# Patient Record
Sex: Female | Born: 1960 | Race: White | Hispanic: No | Smoking: Never smoker
Health system: Southern US, Community
[De-identification: ages and names within clinical notes are randomized; demographics above are authoritative.]

## PROBLEM LIST (undated history)

## (undated) DIAGNOSIS — R87619 Unspecified abnormal cytological findings in specimens from cervix uteri: Secondary | ICD-10-CM

## (undated) DIAGNOSIS — Z1371 Encounter for nonprocreative screening for genetic disease carrier status: Secondary | ICD-10-CM

## (undated) DIAGNOSIS — Z8041 Family history of malignant neoplasm of ovary: Secondary | ICD-10-CM

## (undated) HISTORY — DX: Encounter for nonprocreative screening for genetic disease carrier status: Z13.71

## (undated) HISTORY — DX: Family history of malignant neoplasm of ovary: Z80.41

## (undated) HISTORY — DX: Unspecified abnormal cytological findings in specimens from cervix uteri: R87.619

## (undated) HISTORY — PX: CERVICAL BIOPSY  W/ LOOP ELECTRODE EXCISION: SUR135

---

## 2007-06-04 ENCOUNTER — Ambulatory Visit: Payer: Self-pay | Admitting: Dermatology

## 2007-08-15 ENCOUNTER — Ambulatory Visit: Payer: Self-pay | Admitting: Unknown Physician Specialty

## 2007-08-19 ENCOUNTER — Ambulatory Visit: Payer: Self-pay | Admitting: Unknown Physician Specialty

## 2007-09-15 ENCOUNTER — Ambulatory Visit: Payer: Self-pay

## 2009-03-23 ENCOUNTER — Ambulatory Visit: Payer: Self-pay | Admitting: Otolaryngology

## 2009-10-18 ENCOUNTER — Ambulatory Visit: Payer: Self-pay | Admitting: General Surgery

## 2010-12-27 ENCOUNTER — Ambulatory Visit: Payer: Self-pay | Admitting: Family Medicine

## 2010-12-29 ENCOUNTER — Ambulatory Visit: Payer: Self-pay | Admitting: Internal Medicine

## 2011-01-04 ENCOUNTER — Ambulatory Visit: Payer: Self-pay

## 2014-07-03 ENCOUNTER — Ambulatory Visit
Admit: 2014-07-03 | Disposition: A | Discharge status: Home / Self Care | Payer: Self-pay | Attending: Family Medicine | Admitting: Family Medicine

## 2014-07-19 DIAGNOSIS — I428 Other cardiomyopathies: Secondary | ICD-10-CM | POA: Insufficient documentation

## 2014-07-19 DIAGNOSIS — R002 Palpitations: Secondary | ICD-10-CM | POA: Insufficient documentation

## 2014-07-19 DIAGNOSIS — I429 Cardiomyopathy, unspecified: Secondary | ICD-10-CM | POA: Insufficient documentation

## 2015-09-27 ENCOUNTER — Other Ambulatory Visit: Payer: Self-pay | Admitting: Obstetrics and Gynecology

## 2015-11-09 LAB — HM PAP SMEAR: HM Pap smear: NORMAL

## 2015-11-14 ENCOUNTER — Other Ambulatory Visit: Payer: Self-pay | Admitting: Obstetrics and Gynecology

## 2015-11-14 DIAGNOSIS — Z1231 Encounter for screening mammogram for malignant neoplasm of breast: Secondary | ICD-10-CM

## 2015-12-01 ENCOUNTER — Encounter: Payer: Self-pay | Admitting: Radiology

## 2015-12-01 ENCOUNTER — Ambulatory Visit
Admission: RE | Admit: 2015-12-01 | Discharge: 2015-12-01 | Disposition: A | Discharge status: Home / Self Care | Payer: BLUE CROSS/BLUE SHIELD | Source: Ambulatory Visit | Attending: Obstetrics and Gynecology | Admitting: Obstetrics and Gynecology

## 2015-12-01 DIAGNOSIS — Z1231 Encounter for screening mammogram for malignant neoplasm of breast: Secondary | ICD-10-CM | POA: Insufficient documentation

## 2016-07-04 DIAGNOSIS — H2513 Age-related nuclear cataract, bilateral: Secondary | ICD-10-CM | POA: Insufficient documentation

## 2017-01-08 ENCOUNTER — Telehealth: Payer: Self-pay | Admitting: Obstetrics and Gynecology

## 2017-01-08 DIAGNOSIS — Z1239 Encounter for other screening for malignant neoplasm of breast: Secondary | ICD-10-CM

## 2017-01-08 NOTE — Telephone Encounter (Signed)
Patient needs order for her screening mammogram sent to [facility].  She is scheduled for her annual with Jean Rosenthal on 02/07/2017.  She wants mammogram scheduled for the same day but [facility] needs the order before they will schedule the mammogram.

## 2017-02-04 ENCOUNTER — Ambulatory Visit
Admission: RE | Admit: 2017-02-04 | Discharge: 2017-02-04 | Disposition: A | Discharge status: Home / Self Care | Payer: BLUE CROSS/BLUE SHIELD | Source: Ambulatory Visit | Attending: Obstetrics and Gynecology | Admitting: Obstetrics and Gynecology

## 2017-02-04 DIAGNOSIS — Z1231 Encounter for screening mammogram for malignant neoplasm of breast: Secondary | ICD-10-CM | POA: Insufficient documentation

## 2017-02-04 DIAGNOSIS — Z1239 Encounter for other screening for malignant neoplasm of breast: Secondary | ICD-10-CM

## 2017-02-07 ENCOUNTER — Ambulatory Visit (INDEPENDENT_AMBULATORY_CARE_PROVIDER_SITE_OTHER): Payer: BLUE CROSS/BLUE SHIELD | Admitting: Obstetrics and Gynecology

## 2017-02-07 ENCOUNTER — Encounter: Payer: Self-pay | Admitting: Obstetrics and Gynecology

## 2017-02-07 VITALS — BP 122/70 | Ht 67.0 in | Wt 164.0 lb

## 2017-02-07 DIAGNOSIS — Z0000 Encounter for general adult medical examination without abnormal findings: Secondary | ICD-10-CM

## 2017-02-07 DIAGNOSIS — Z1331 Encounter for screening for depression: Secondary | ICD-10-CM

## 2017-02-07 DIAGNOSIS — Z1321 Encounter for screening for nutritional disorder: Secondary | ICD-10-CM | POA: Diagnosis not present

## 2017-02-07 DIAGNOSIS — Z131 Encounter for screening for diabetes mellitus: Secondary | ICD-10-CM

## 2017-02-07 DIAGNOSIS — Z1339 Encounter for screening examination for other mental health and behavioral disorders: Secondary | ICD-10-CM

## 2017-02-07 DIAGNOSIS — Z01419 Encounter for gynecological examination (general) (routine) without abnormal findings: Secondary | ICD-10-CM | POA: Diagnosis not present

## 2017-02-07 DIAGNOSIS — Z13220 Encounter for screening for lipoid disorders: Secondary | ICD-10-CM

## 2017-02-07 DIAGNOSIS — Z1329 Encounter for screening for other suspected endocrine disorder: Secondary | ICD-10-CM

## 2017-02-07 DIAGNOSIS — Z124 Encounter for screening for malignant neoplasm of cervix: Secondary | ICD-10-CM

## 2017-02-07 NOTE — Progress Notes (Signed)
Routine Annual Gynecology Examination   PCP: Patient, No Pcp Per  Chief Complaint:  Chief Complaint  Patient presents with  . Annual Exam    History of Present Illness: Patient is a 56 y.o. G3P3003 presents for annual exam. The patient has no complaints today.   Menopausal bleeding: denies  Menopausal symptoms: reports (reports mild)  Breast symptoms: denies  Last pap smear: 1 years ago.  Result Normal  (LEEP 2 years ago - CIN 1 result)  Last mammogram: This week.  Result Normal  Past Medical History:  Diagnosis Date  . Abnormal Pap smear of cervix     Past Surgical History:  Procedure Laterality Date  . CERVICAL BIOPSY  W/ LOOP ELECTRODE EXCISION      Prior to Admission medications   Medication Sig Start Date End Date Taking? Authorizing Provider  Multiple Vitamin (MULTIVITAMIN) tablet Take 1 tablet by mouth daily.   Yes [provider]    Allergies  Allergen Reactions  . Sulfa Antibiotics Rash     Obstetric History: [id number]  Social History   Socioeconomic History  . Marital status: Widowed    Spouse name: Not on file  . Number of children: Not on file  . Years of education: Not on file  . Highest education level: Not on file  Social Needs  . Financial resource strain: Not on file  . Food insecurity - worry: Not on file  . Food insecurity - inability: Not on file  . Transportation needs - medical: Not on file  . Transportation needs - non-medical: Not on file  Occupational History  . Not on file  Tobacco Use  . Smoking status: Never Smoker  . Smokeless tobacco: Never Used  Substance and Sexual Activity  . Alcohol use: Yes  . Drug use: No  . Sexual activity: Yes    Birth control/protection: Post-menopausal  Other Topics Concern  . Not on file  Social History Narrative  . Not on file    Family History  Problem Relation Age of Onset  . Breast cancer Maternal Grandmother   . Diabetes Mellitus II Maternal Grandmother   . Ovarian  cancer Maternal Grandmother   . Hypertension Mother   . Diabetes Mellitus II Father   . Hypertension Father   . Colon cancer Paternal Uncle   . Lung cancer Paternal Uncle     Review of Systems  Constitutional: Negative.   HENT: Negative.   Eyes: Negative.   Respiratory: Negative.   Cardiovascular: Negative.   Gastrointestinal: Negative.   Genitourinary: Negative.   Musculoskeletal: Negative.   Skin: Negative.   Neurological: Negative.   Psychiatric/Behavioral: Negative.      Physical Exam Vitals: BP 122/70   Ht 5\' 7"  (1.702 m)   Wt 164 lb (74.4 kg)   BMI 25.69 kg/m   Physical Exam  Constitutional: She is oriented to person, place, and time. She appears well-developed and well-nourished. No distress.  Genitourinary: Uterus normal. Pelvic exam was performed with patient supine. There is no rash, tenderness, lesion or injury on the right labia. There is no rash, tenderness, lesion or injury on the left labia. No erythema, tenderness or bleeding in the vagina. No signs of injury around the vagina. No vaginal discharge found. Right adnexum does not display mass, does not display tenderness and does not display fullness. Left adnexum does not display mass, does not display tenderness and does not display fullness. Cervix does not exhibit motion tenderness, lesion, discharge or polyp.   Uterus  is mobile and anteverted. Uterus is not enlarged, tender or exhibiting a mass.  HENT:  Head: Normocephalic and atraumatic.  Eyes: EOM are normal. No scleral icterus.  Neck: Normal range of motion. Neck supple. No thyromegaly present.  Cardiovascular: Normal rate and regular rhythm. Exam reveals no gallop and no friction rub.  No murmur heard. Pulmonary/Chest: Effort normal and breath sounds normal. No respiratory distress. She has no wheezes. She has no rales. Right breast exhibits no inverted nipple, no mass, no nipple discharge, no skin change and no tenderness. Left breast exhibits no  inverted nipple, no mass, no nipple discharge, no skin change and no tenderness.  Abdominal: Soft. Bowel sounds are normal. She exhibits no distension and no mass. There is no tenderness. There is no rebound and no guarding.  Musculoskeletal: Normal range of motion. She exhibits no edema or tenderness.  Lymphadenopathy:    She has no cervical adenopathy.       Right: No inguinal adenopathy present.       Left: No inguinal adenopathy present.  Neurological: She is alert and oriented to person, place, and time. No cranial nerve deficit.  Skin: Skin is warm and dry. No rash noted. No erythema.  Psychiatric: She has a normal mood and affect. Her behavior is normal. Judgment normal.    Female chaperone present for pelvic and breast  portions of the physical exam  Results: AUDIT Questionnaire (screen for alcoholism): 1 PHQ-9: 2  Assessment and Plan:  56 y.o. G58P3003 female here for routine annual gynecologic examination  Plan: Problem List Items Addressed This Visit    None    Visit Diagnoses    Women's annual routine gynecological examination    -  Primary   Relevant Orders   Hgb A1c w/o eAG   VITAMIN D 25 Hydroxy (Vit-D Deficiency, Fractures)   TSH + free T4   Lipid Panel With LDL/HDL Ratio   Comprehensive metabolic panel   CBC   IGP, Aptima HPV, rfx 16/18,45   Screening for depression       Screening for alcohol problem       Screening cholesterol level       Relevant Orders   Lipid Panel With LDL/HDL Ratio   Screening for thyroid disorder       Relevant Orders   TSH + free T4   Screening for diabetes mellitus       Relevant Orders   Hgb A1c w/o eAG   Blood tests for routine general physical examination       Relevant Orders   Comprehensive metabolic panel   CBC   Encounter for vitamin deficiency screening       Relevant Orders   VITAMIN D 25 Hydroxy (Vit-D Deficiency, Fractures)   Pap smear for cervical cancer screening       Relevant Orders   IGP, Aptima HPV, rfx  16/18,45      Screening: -- Blood pressure screen normal -- Colonoscopy - not due -- Mammogram - not due -- Weight screening: normal -- Depression screening negative (PHQ-9) -- Nutrition: normal -- cholesterol screening: will obtain -- osteoporosis screening: not due -- tobacco screening: not using -- alcohol screening: AUDIT questionnaire indicates low-risk usage. -- family history of breast cancer screening: done. not at high risk. -- no evidence of domestic violence or intimate partner violence. -- STD screening: gonorrhea/chlamydia NAAT not collected per patient request. -- pap smear collected per ASCCP guidelines -- HPV vaccination series: not eligilbe   Thomasene Mohair, MD  02/07/2017 1:14 PM

## 2017-02-08 LAB — LIPID PANEL WITH LDL/HDL RATIO
Cholesterol, Total: 183 mg/dL (ref 100–199)
HDL: 65 mg/dL (ref >39)
LDL CALC: 99 mg/dL (ref 0–99)
LDL/HDL RATIO: 1.5 ratio (ref 0.0–3.2)
Triglycerides: 94 mg/dL (ref 0–149)
VLDL CHOLESTEROL CAL: 19 mg/dL (ref 5–40)

## 2017-02-08 LAB — COMPREHENSIVE METABOLIC PANEL
ALK PHOS: 89 IU/L (ref 39–117)
ALT: 20 IU/L (ref 0–32)
AST: 21 IU/L (ref 0–40)
Albumin/Globulin Ratio: 2.2 (ref 1.2–2.2)
Albumin: 4.6 g/dL (ref 3.5–5.5)
BUN/Creatinine Ratio: 14 (ref 9–23)
BUN: 13 mg/dL (ref 6–24)
Bilirubin Total: 0.3 mg/dL (ref 0.0–1.2)
CHLORIDE: 105 mmol/L (ref 96–106)
CO2: 23 mmol/L (ref 20–29)
Calcium: 10.1 mg/dL (ref 8.7–10.2)
Creatinine, Ser: 0.9 mg/dL (ref 0.57–1.00)
GFR calc Af Amer: 84 mL/min/{1.73_m2} (ref >59)
GFR calc non Af Amer: 73 mL/min/{1.73_m2} (ref >59)
GLUCOSE: 84 mg/dL (ref 65–99)
Globulin, Total: 2.1 g/dL (ref 1.5–4.5)
Potassium: 4.2 mmol/L (ref 3.5–5.2)
Sodium: 144 mmol/L (ref 134–144)
Total Protein: 6.7 g/dL (ref 6.0–8.5)

## 2017-02-08 LAB — CBC
HEMATOCRIT: 40.3 % (ref 34.0–46.6)
Hemoglobin: 13.5 g/dL (ref 11.1–15.9)
MCH: 31 pg (ref 26.6–33.0)
MCHC: 33.5 g/dL (ref 31.5–35.7)
MCV: 93 fL (ref 79–97)
PLATELETS: 248 10*3/uL (ref 150–379)
RBC: 4.35 x10E6/uL (ref 3.77–5.28)
RDW: 13.4 % (ref 12.3–15.4)
WBC: 6.2 10*3/uL (ref 3.4–10.8)

## 2017-02-08 LAB — TSH+FREE T4
Free T4: 1.31 ng/dL (ref 0.82–1.77)
TSH: 1.67 u[IU]/mL (ref 0.450–4.500)

## 2017-02-08 LAB — VITAMIN D 25 HYDROXY (VIT D DEFICIENCY, FRACTURES): Vit D, 25-Hydroxy: 38 ng/mL (ref 30.0–100.0)

## 2017-02-08 LAB — HGB A1C W/O EAG: HEMOGLOBIN A1C: 5.2 % (ref 4.8–5.6)

## 2017-02-12 LAB — IGP, APTIMA HPV, RFX 16/18,45
HPV Aptima: NEGATIVE
PAP Smear Comment: 0

## 2017-02-19 ENCOUNTER — Encounter: Payer: Self-pay | Admitting: Obstetrics and Gynecology

## 2017-07-04 ENCOUNTER — Telehealth: Payer: Self-pay

## 2017-07-04 NOTE — Telephone Encounter (Signed)
Carla Krause calling to see if they can refax this specific form, I have not received anything on this yet. Will show SDJ once I get fax

## 2017-07-04 NOTE — Telephone Encounter (Signed)
Pt calling triage line to speak to SDJ regarding a possible Rx. She states she recently went to a HRT conference. She is interested in trying a hormone cream. (Name of medication not provided). She states the representative for the cream is going to help her out. He sent over a form for a Rx and she hasn't heard anything from [facility] about it.  CB# [phone]

## 2017-07-15 NOTE — Telephone Encounter (Signed)
Pt f/u to verify if we received fax from pharmacy on hormone cream and if it is going to be approved by SDJ. Cb#[phone]

## 2017-07-16 NOTE — Telephone Encounter (Signed)
Spoke with pt. Aware that I will speak with SDj Thursday and call her as soon as he lets me know about approval. Pt appreciative and states that [facility] had approved it before if that will help with his approval.

## 2017-07-16 NOTE — Telephone Encounter (Signed)
Since pt was not on this medication at last visit, I have to get SDJ approval. He will be back in office Thursday.

## 2017-07-16 NOTE — Telephone Encounter (Signed)
Ok. Please make sure I see this form on Thursday.  Thank you!

## 2017-07-16 NOTE — Telephone Encounter (Signed)
Pt aware of this via vm this AM

## 2017-07-16 NOTE — Telephone Encounter (Signed)
I received fax form while SDJ out last week. And I was out yesterday.

## 2017-07-18 NOTE — Telephone Encounter (Signed)
Pt aware that RX has been sent. 

## 2017-09-19 ENCOUNTER — Telehealth: Payer: Self-pay

## 2017-09-19 NOTE — Telephone Encounter (Signed)
I approved the rx. Please see note on the MA desk on my hallway to fax back to [facility]. Thanks!

## 2017-09-19 NOTE — Telephone Encounter (Signed)
Pt reports a rf request was sent from her pharmacy 2x in the past 2 wks for her hormone cream & hasn't been approved yet. Pt inquiring why or if this can be refilled. Cb#[phone]

## 2017-09-20 NOTE — Telephone Encounter (Signed)
Rx was faxed earlier today. Pt aware.

## 2017-11-18 ENCOUNTER — Telehealth: Payer: Self-pay

## 2017-11-18 NOTE — Telephone Encounter (Signed)
Pt called for refill of hormone cream and hasn't heard back.  [phone]

## 2017-11-21 NOTE — Telephone Encounter (Signed)
Just ask her if this is the same dose.  I'm ok to approve.  But, I'm not sure whether the dose has changed.  Usually, there is a paper to sign.  If there is no paper, perhaps [facility] can re-send a copy of the prescription.

## 2017-11-21 NOTE — Telephone Encounter (Signed)
Spoke with pt, informed her I have not seen a form regarding this RX and she will get pharmacist to refax it now and I will make sure SDJ signs it tomorrow assuming we get fax before tomororw. Will call pt tomorrow to update her.

## 2017-11-22 ENCOUNTER — Encounter: Payer: Self-pay | Admitting: Obstetrics and Gynecology

## 2017-11-22 NOTE — Telephone Encounter (Signed)
Received a RX this morning, called to clarify. It was the wrong RX that was sent over. So waiting and the correct/new RX to be sent over for SDJ to sign.

## 2017-11-22 NOTE — Telephone Encounter (Signed)
Pt aware that I faxed in the RX

## 2018-02-12 ENCOUNTER — Telehealth: Payer: Self-pay

## 2018-02-12 NOTE — Telephone Encounter (Signed)
Pt calling for BS. States needs hormone cream refill.  [phone]

## 2018-02-12 NOTE — Telephone Encounter (Signed)
Is that the one on my desk from the pharmacist?

## 2018-02-14 NOTE — Telephone Encounter (Signed)
Yes sir it is

## 2018-02-14 NOTE — Telephone Encounter (Signed)
For the sake of completion, I signed this and handed back to you. Please fax back. Thank you!

## 2018-02-18 NOTE — Telephone Encounter (Signed)
Called pt to let her know. Please relay message if she calls back. Thank you

## 2018-03-13 ENCOUNTER — Other Ambulatory Visit: Payer: Self-pay | Admitting: Obstetrics and Gynecology

## 2018-03-26 ENCOUNTER — Telehealth: Payer: Self-pay

## 2018-03-26 NOTE — Telephone Encounter (Signed)
Pt needs refill of hormone capsules; pharm is going to send fax.  Will be out after Friday.  [phone]

## 2018-03-26 NOTE — Telephone Encounter (Signed)
Will give RX to SDJ tomorrow when he is in office

## 2018-04-02 ENCOUNTER — Other Ambulatory Visit (HOSPITAL_COMMUNITY)
Admission: RE | Admit: 2018-04-02 | Discharge: 2018-04-02 | Disposition: A | Discharge status: Home / Self Care | Payer: BLUE CROSS/BLUE SHIELD | Source: Ambulatory Visit | Attending: Obstetrics and Gynecology | Admitting: Obstetrics and Gynecology

## 2018-04-02 ENCOUNTER — Ambulatory Visit (INDEPENDENT_AMBULATORY_CARE_PROVIDER_SITE_OTHER): Payer: BLUE CROSS/BLUE SHIELD | Admitting: Obstetrics and Gynecology

## 2018-04-02 ENCOUNTER — Encounter: Payer: Self-pay | Admitting: Obstetrics and Gynecology

## 2018-04-02 VITALS — BP 120/84 | HR 67 | Ht 67.0 in | Wt 184.0 lb

## 2018-04-02 DIAGNOSIS — Z1239 Encounter for other screening for malignant neoplasm of breast: Secondary | ICD-10-CM

## 2018-04-02 DIAGNOSIS — Z1211 Encounter for screening for malignant neoplasm of colon: Secondary | ICD-10-CM | POA: Diagnosis not present

## 2018-04-02 DIAGNOSIS — N951 Menopausal and female climacteric states: Secondary | ICD-10-CM

## 2018-04-02 DIAGNOSIS — Z124 Encounter for screening for malignant neoplasm of cervix: Secondary | ICD-10-CM

## 2018-04-02 DIAGNOSIS — Z131 Encounter for screening for diabetes mellitus: Secondary | ICD-10-CM

## 2018-04-02 DIAGNOSIS — Z01419 Encounter for gynecological examination (general) (routine) without abnormal findings: Secondary | ICD-10-CM | POA: Diagnosis not present

## 2018-04-02 DIAGNOSIS — Z79890 Hormone replacement therapy (postmenopausal): Secondary | ICD-10-CM

## 2018-04-02 DIAGNOSIS — Z0000 Encounter for general adult medical examination without abnormal findings: Secondary | ICD-10-CM

## 2018-04-02 DIAGNOSIS — Z1151 Encounter for screening for human papillomavirus (HPV): Secondary | ICD-10-CM | POA: Insufficient documentation

## 2018-04-02 DIAGNOSIS — Z13220 Encounter for screening for lipoid disorders: Secondary | ICD-10-CM

## 2018-04-02 DIAGNOSIS — Z8041 Family history of malignant neoplasm of ovary: Secondary | ICD-10-CM

## 2018-04-02 DIAGNOSIS — Z1329 Encounter for screening for other suspected endocrine disorder: Secondary | ICD-10-CM

## 2018-04-02 LAB — HEMOCCULT GUIAC POC 1CARD (OFFICE): Fecal Occult Blood, POC: NEGATIVE

## 2018-04-02 NOTE — Progress Notes (Signed)
PCP: Patient, No Pcp Per   Chief Complaint  Patient presents with  . Gynecologic Exam    HPI:      [name] is a 58 y.o. [phone] who LMP was No LMP recorded. Patient is postmenopausal., presents today for her annual examination.  Her menses are absent due to menopause. She does not have postmenopausal bleeding. She does have vasomotor sx, improved with prog 100 mg QHS, started after working with Bufford Lope at [facility]. Pt also having insomnia, wt gain, and memory issues. Insomnia improving with prog. Changed from cream to caps about 5 wks ago with improvement.  Sex activity: not sexually active. She does not have vaginal dryness.  Last Pap: February 07, 2017  Results were: no abnormalities /neg HPV DNA.  Hx of STDs: HPV  Last mammogram: February 04, 2017  Results were: normal--routine follow-up in 12 months There is no FH of breast cancer. There is a FH of ovarian cancer in her MGM, genetic testing not done. The patient does do self-breast exams.  Colonoscopy: never. FH colon cancer in pat uncle so doesn't qualify for cologuard.   Tobacco use: The patient denies current or previous tobacco use. Alcohol use: social drinker Exercise: moderately active  She does get adequate calcium and Vitamin D in her diet.  Labs normal 12/18 but pt likes regularly.  Past Medical History:  Diagnosis Date  . Abnormal Pap smear of cervix   . Family history of ovarian cancer     Past Surgical History:  Procedure Laterality Date  . CERVICAL BIOPSY  W/ LOOP ELECTRODE EXCISION      Family History  Problem Relation Age of Onset  . Breast cancer Maternal Grandmother 60  . Diabetes Mellitus II Maternal Grandmother   . Ovarian cancer Maternal Grandmother 60  . Hypertension Mother   . Diabetes Mellitus II Father   . Hypertension Father   . Colon cancer Paternal Uncle 40  . Lung cancer Paternal Uncle   . Melanoma Brother     Social History   Socioeconomic History  . Marital  status: Widowed    Spouse name: Not on file  . Number of children: Not on file  . Years of education: Not on file  . Highest education level: Not on file  Occupational History  . Not on file  Social Needs  . Financial resource strain: Not on file  . Food insecurity:    Worry: Not on file    Inability: Not on file  . Transportation needs:    Medical: Not on file    Non-medical: Not on file  Tobacco Use  . Smoking status: Never Smoker  . Smokeless tobacco: Never Used  Substance and Sexual Activity  . Alcohol use: Yes  . Drug use: No  . Sexual activity: Not Currently    Birth control/protection: Post-menopausal  Lifestyle  . Physical activity:    Days per week: Not on file    Minutes per session: Not on file  . Stress: Not on file  Relationships  . Social connections:    Talks on phone: Not on file    Gets together: Not on file    Attends religious service: Not on file    Active member of club or organization: Not on file    Attends meetings of clubs or organizations: Not on file    Relationship status: Not on file  . Intimate partner violence:    Fear of current or ex partner: Not  on file    Emotionally abused: Not on file    Physically abused: Not on file    Forced sexual activity: Not on file  Other Topics Concern  . Not on file  Social History Narrative  . Not on file    Outpatient Medications Prior to Visit  Medication Sig Dispense Refill  . Multiple Vitamin (MULTIVITAMIN) tablet Take 1 tablet by mouth daily.    . progesterone (PROMETRIUM) 100 MG capsule Take 100 mg by mouth daily.     No facility-administered medications prior to visit.      ROS:  Review of Systems  Constitutional: Negative for fatigue, fever and unexpected weight change.  Respiratory: Negative for cough, shortness of breath and wheezing.   Cardiovascular: Negative for chest pain, palpitations and leg swelling.  Gastrointestinal: Negative for blood in stool, constipation, diarrhea,  nausea and vomiting.  Endocrine: Negative for cold intolerance, heat intolerance and polyuria.  Genitourinary: Negative for dyspareunia, dysuria, flank pain, frequency, genital sores, hematuria, menstrual problem, pelvic pain, urgency, vaginal bleeding, vaginal discharge and vaginal pain.  Musculoskeletal: Negative for back pain, joint swelling and myalgias.  Skin: Negative for rash.  Neurological: Negative for dizziness, syncope, light-headedness, numbness and headaches.  Hematological: Negative for adenopathy.  Psychiatric/Behavioral: Negative for agitation, confusion, sleep disturbance and suicidal ideas. The patient is not nervous/anxious.    BREAST: No symptoms    Objective: BP 120/84   Pulse 67   Ht 5\' 7"  (1.702 m)   Wt 184 lb (83.5 kg)   BMI 28.82 kg/m    Physical Exam Constitutional:      Appearance: She is well-developed.  Genitourinary:     Vulva, vagina, cervix, uterus, right adnexa and left adnexa normal.     No vulval lesion or tenderness noted.     No vaginal discharge, erythema or tenderness.     No cervical polyp.     Uterus is not enlarged or tender.     No right or left adnexal mass present.     Right adnexa not tender.     Left adnexa not tender.  Rectum:     Guaiac result negative.     No tenderness.  Neck:     Musculoskeletal: Normal range of motion.     Thyroid: No thyromegaly.  Cardiovascular:     Rate and Rhythm: Normal rate and regular rhythm.     Heart sounds: Normal heart sounds. No murmur.  Pulmonary:     Effort: Pulmonary effort is normal.     Breath sounds: Normal breath sounds.  Chest:     Breasts:        Right: No mass, nipple discharge, skin change or tenderness.        Left: No mass, nipple discharge, skin change or tenderness.  Abdominal:     Palpations: Abdomen is soft.     Tenderness: There is no abdominal tenderness. There is no guarding.  Musculoskeletal: Normal range of motion.  Neurological:     Mental Status: She is  alert and oriented to person, place, and time.     Cranial Nerves: No cranial nerve deficit.  Psychiatric:        Behavior: Behavior normal.  Vitals signs reviewed.     Results: Results for orders placed or performed in visit on 04/02/18 (from the past 24 hour(s))  POCT Occult Blood Stool     Status: Normal   Collection Time: 04/02/18  9:15 AM  Result Value Ref Range   Fecal Occult Blood,  POC Negative Negative   Card #1 Date     Card #2 Fecal Occult Blod, POC     Card #2 Date     Card #3 Fecal Occult Blood, POC     Card #3 Date      Assessment/Plan:  Encounter for annual routine gynecological examination  Cervical cancer screening - Plan: Cytology - PAP  Screening for HPV (human papillomavirus) - Plan: Cytology - PAP  Screening for breast cancer - Pt to sched mammo - Plan: MM 3D SCREEN BREAST BILATERAL  Family history of ovarian cancer - MyRisk testing discussed and done today. Will call with resutls.  - Plan: Integrated BRACAnalysis  Blood tests for routine general physical examination - Plan: Comprehensive metabolic panel, Lipid panel, TSH + free T4, Hemoglobin A1c  Screening cholesterol level - Plan: Lipid panel  Screening for diabetes mellitus - Plan: Hemoglobin A1c  Thyroid disorder screening - Plan: TSH + free T4  Screening for colon cancer - Neg FOBT. Refer to GI for scr colonoscopy due to age. - Plan: POCT Occult Blood Stool, Ambulatory referral to Gastroenterology  Vasomotor symptoms due to menopause - Sx improving with oral prog. Has f/u with Bufford Lope at [facility] in 1 wk. If still sx, can increase to 150 mg dose. Pt to f/u prn.  Hormone replacement therapy (HRT)          GYN counsel mammography screening, menopause, adequate intake of calcium and vitamin D, diet and exercise    F/U  Return in about 1 year (around 04/03/2019).  Alicia B. Copland, PA-C 04/02/2018 9:17 AM

## 2018-04-02 NOTE — Patient Instructions (Addendum)
I value your feedback and entrusting [place] with your care. If you get a [facility] patient survey, I would appreciate you taking the time to let us know about your experience today. Thank you!  [facility] at [facility]: [phone]

## 2018-04-03 ENCOUNTER — Telehealth: Payer: Self-pay

## 2018-04-03 ENCOUNTER — Other Ambulatory Visit: Payer: Self-pay

## 2018-04-03 DIAGNOSIS — Z1211 Encounter for screening for malignant neoplasm of colon: Secondary | ICD-10-CM

## 2018-04-03 LAB — LIPID PANEL
Chol/HDL Ratio: 3 ratio (ref 0.0–4.4)
Cholesterol, Total: 200 mg/dL — ABNORMAL HIGH (ref 100–199)
HDL: 67 mg/dL (ref >39)
LDL Calculated: 113 mg/dL — ABNORMAL HIGH (ref 0–99)
Triglycerides: 102 mg/dL (ref 0–149)
VLDL Cholesterol Cal: 20 mg/dL (ref 5–40)

## 2018-04-03 LAB — COMPREHENSIVE METABOLIC PANEL
ALT: 33 IU/L — ABNORMAL HIGH (ref 0–32)
AST: 30 IU/L (ref 0–40)
Albumin/Globulin Ratio: 2.1 (ref 1.2–2.2)
Albumin: 4.6 g/dL (ref 3.8–4.9)
Alkaline Phosphatase: 89 IU/L (ref 39–117)
BUN/Creatinine Ratio: 16 (ref 9–23)
BUN: 16 mg/dL (ref 6–24)
Bilirubin Total: 0.2 mg/dL (ref 0.0–1.2)
CO2: 20 mmol/L (ref 20–29)
CREATININE: 0.97 mg/dL (ref 0.57–1.00)
Calcium: 10.5 mg/dL — ABNORMAL HIGH (ref 8.7–10.2)
Chloride: 105 mmol/L (ref 96–106)
GFR calc Af Amer: 76 mL/min/{1.73_m2} (ref >59)
GFR calc non Af Amer: 66 mL/min/{1.73_m2} (ref >59)
Globulin, Total: 2.2 g/dL (ref 1.5–4.5)
Glucose: 84 mg/dL (ref 65–99)
Potassium: 4.2 mmol/L (ref 3.5–5.2)
Sodium: 139 mmol/L (ref 134–144)
TOTAL PROTEIN: 6.8 g/dL (ref 6.0–8.5)

## 2018-04-03 LAB — TSH+FREE T4
Free T4: 1.45 ng/dL (ref 0.82–1.77)
TSH: 1.13 u[IU]/mL (ref 0.450–4.500)

## 2018-04-03 LAB — HEMOGLOBIN A1C
Est. average glucose Bld gHb Est-mCnc: 103 mg/dL
Hgb A1c MFr Bld: 5.2 % (ref 4.8–5.6)

## 2018-04-03 NOTE — Progress Notes (Signed)
Pls give pt lab results. Calcium/ALT normal. Lipids normal and ok.

## 2018-04-03 NOTE — Telephone Encounter (Signed)
Call returned.  Colonoscopy scheduled for 04/16/18 at [facility] with Dr. Allegra Lai.  Thanks [employer]

## 2018-04-03 NOTE — Telephone Encounter (Signed)
Pt left vm to schedule a colonoscopy   °

## 2018-04-04 ENCOUNTER — Other Ambulatory Visit: Payer: Self-pay

## 2018-04-04 LAB — CYTOLOGY - PAP
Diagnosis: NEGATIVE
HPV (WINDOPATH): NOT DETECTED

## 2018-04-06 ENCOUNTER — Other Ambulatory Visit: Payer: Self-pay

## 2018-04-06 ENCOUNTER — Ambulatory Visit
Admission: EM | Admit: 2018-04-06 | Discharge: 2018-04-06 | Disposition: A | Discharge status: Home / Self Care | Payer: BLUE CROSS/BLUE SHIELD | Attending: Family Medicine | Admitting: Family Medicine

## 2018-04-06 ENCOUNTER — Ambulatory Visit (INDEPENDENT_AMBULATORY_CARE_PROVIDER_SITE_OTHER): Payer: BLUE CROSS/BLUE SHIELD

## 2018-04-06 ENCOUNTER — Encounter: Payer: Self-pay | Admitting: Gynecology

## 2018-04-06 DIAGNOSIS — S9001XA Contusion of right ankle, initial encounter: Secondary | ICD-10-CM

## 2018-04-06 DIAGNOSIS — W228XXA Striking against or struck by other objects, initial encounter: Secondary | ICD-10-CM

## 2018-04-06 DIAGNOSIS — M25571 Pain in right ankle and joints of right foot: Secondary | ICD-10-CM

## 2018-04-06 MED ORDER — ETODOLAC 500 MG PO TABS: 500.0000 mg | ORAL_TABLET | Freq: Two times a day (BID) | ORAL | 0 refills | Status: AC | PRN

## 2018-04-06 NOTE — Discharge Instructions (Addendum)
Recommend use Lodine 500mg  twice a day as needed for pain and swelling. Keep foot elevated as much as possible. Limit walking and advance activity as tolerated. Follow-up in 7 to 10 days if not improving.

## 2018-04-06 NOTE — ED Provider Notes (Signed)
[facility]    CSN: [id number] Arrival date & time: 04/06/18  1333     History   Chief Complaint Chief Complaint  Patient presents with  . Appointment  . Ankle Injury    HPI Carla Krause is a 58 y.o. female.   58 year old female presents with injury to her right ankle 3 days ago. She was moving a head board for a bed when it slipped and fell on her right ankle. She had immediate pain and some swelling but now concerned over possible fracture. She can fully move her ankle but pain and swelling continues. She has taken Tylenol with some relief. Has twisted/sprained ankle before but no previous fracture. No other chronic health issues. Takes Progesterone and multivitamin daily.   The history is provided by the patient.    Past Medical History:  Diagnosis Date  . Abnormal Pap smear of cervix   . Family history of ovarian cancer     Patient Active Problem List   Diagnosis Date Noted  . 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 Palpitations 07/19/2014  . Cardiomyopathy, idiopathic (HCC) 07/19/2014    Past Surgical History:  Procedure Laterality Date  . CERVICAL BIOPSY  W/ LOOP ELECTRODE EXCISION      OB History    Gravida  3   Para  3   Term  3   Preterm      AB      Living  3     SAB      TAB      Ectopic      Multiple      Live Births  3            Home Medications    Prior to Admission medications   Medication Sig Start Date End Date Taking? Authorizing Provider  Multiple Vitamin (MULTIVITAMIN) tablet Take 1 tablet by mouth daily.   Yes [provider]  progesterone (PROMETRIUM) 100 MG capsule Take 100 mg by mouth daily.   Yes [provider]  etodolac (LODINE) 500 MG tablet Take 1 tablet (500 mg total) by mouth 2 (two) times daily as needed (for pain). 04/06/18   Sudie GrumblingAmyot, Ann Berry, NP    Family History Family History  Problem Relation Age of Onset  . Breast cancer Maternal Grandmother 60  . Diabetes  Mellitus II Maternal Grandmother   . Ovarian cancer Maternal Grandmother        60s, no testing done  . Hypertension Mother   . Diabetes Mellitus II Father   . Hypertension Father   . Colon cancer Paternal Uncle        3340s, no testing done  . Lung cancer Paternal Uncle   . Melanoma Brother     Social History Social History   Tobacco Use  . Smoking status: Never Smoker  . Smokeless tobacco: Never Used  Substance Use Topics  . Alcohol use: Yes  . Drug use: No     Allergies   Sulfa antibiotics   Review of Systems Review of Systems  Constitutional: Negative for activity change, appetite change, chills, fatigue and fever.  Respiratory: Negative for chest tightness, shortness of breath and wheezing.   Cardiovascular: Negative for chest pain and leg swelling.  Gastrointestinal: Negative for nausea and vomiting.  Musculoskeletal: Positive for arthralgias, gait problem and joint swelling.  Skin: Positive for wound. Negative for color change and rash.  Neurological: Negative for dizziness, tremors, seizures, syncope, weakness, light-headedness, numbness and  headaches.  Hematological: Negative for adenopathy. Does not bruise/bleed easily.  Psychiatric/Behavioral: Negative.      Physical Exam Triage Vital Signs ED Triage Vitals  Enc Vitals Group     BP 04/06/18 1355 120/64     Pulse Rate 04/06/18 1355 72     Resp 04/06/18 1355 16     Temp 04/06/18 1355 (!) 97.5 F (36.4 C)     Temp Source 04/06/18 1355 Oral     SpO2 04/06/18 1355 100 %     Weight 04/06/18 1358 180 lb (81.6 kg)     Height 04/06/18 1358 5\' 7"  (1.702 m)     Head Circumference --      Peak Flow --      Pain Score 04/06/18 1357 5     Pain Loc --      Pain Edu? --      Excl. in GC? --    No data found.  Updated Vital Signs BP 120/64 (BP Location: Left Arm)   Pulse 72   Temp (!) 97.5 F (36.4 C) (Oral)   Resp 16   Ht 5\' 7"  (1.702 m)   Wt 180 lb (81.6 kg)   SpO2 100%   BMI 28.19 kg/m   Visual  Acuity Right Eye Distance:   Left Eye Distance:   Bilateral Distance:    Right Eye Near:   Left Eye Near:    Bilateral Near:     Physical Exam Vitals signs and nursing note reviewed.  Constitutional:      General: She is awake. She is not in acute distress.    Appearance: Normal appearance. She is well-developed and well-groomed.     Comments: Patient sitting comfortably in exam chair in no acute distress.   HENT:     Head: Normocephalic and atraumatic.     Right Ear: External ear normal.     Left Ear: External ear normal.  Eyes:     Extraocular Movements: Extraocular movements intact.     Conjunctiva/sclera: Conjunctivae normal.  Neck:     Musculoskeletal: Normal range of motion.  Cardiovascular:     Rate and Rhythm: Normal rate.  Pulmonary:     Effort: Pulmonary effort is normal.  Musculoskeletal: Normal range of motion.        General: Swelling, tenderness and signs of injury present.     Right ankle: She exhibits swelling. She exhibits normal range of motion, no ecchymosis and normal pulse. Tenderness. Lateral malleolus tenderness found. Achilles tendon normal.     Right lower leg: No edema.     Left lower leg: No edema.       Feet:     Comments: Has full range of motion of right ankle but pain with rotation and flexion. Swollen around lateral malleolus with small abrasion present. No redness or bruising present. No distinct warmth. Tender around entire lateral malleolus. Normal pulses and capillary refill. No neuro deficits noted.   Skin:    General: Skin is warm and dry.     Capillary Refill: Capillary refill takes less than 2 seconds.     Findings: Abrasion present. No bruising, ecchymosis or erythema.  Neurological:     General: No focal deficit present.     Mental Status: She is alert and oriented to person, place, and time.     Sensory: Sensation is intact. No sensory deficit.     Motor: Motor function is intact.     Gait: Gait is intact.  Psychiatric:  Mood and Affect: Mood normal.        Behavior: Behavior normal. Behavior is cooperative.        Thought Content: Thought content normal.        Judgment: Judgment normal.      UC Treatments / Results  Labs (all labs ordered are listed, but only abnormal results are displayed) Labs Reviewed - No data to display  EKG None  Radiology Dg Ankle Complete Right  Result Date: 04/06/2018 CLINICAL DATA:  Heavy object fell on right lateral malleolus. Pain and abrasion EXAM: RIGHT ANKLE - COMPLETE 3+ VIEW COMPARISON:  None. FINDINGS: No fracture or bone lesion. Ankle joint normally spaced and aligned.  No arthropathic changes. Moderate-sized plantar calcaneal spur. Soft tissues are unremarkable. IMPRESSION: 1. No fracture or ankle joint abnormality. 2. Moderate plantar calcaneal spur. Electronically Signed   By: Amie Portland M.D.   On: 04/06/2018 14:19    Procedures Procedures (including critical care time)  Medications Ordered in UC Medications - No data to display  Initial Impression / Assessment and Plan / UC Course  I have reviewed the triage vital signs and the nursing notes.  Pertinent labs & imaging results that were available during my care of the patient were reviewed by me and considered in my medical decision making (see chart for details).    Reviewed x-ray results with patient- no distinct fracture. Presence of heel spur- patient previously informed. Offered ace wrap but patient declined since wrap/pressure seems to increase pain at this time. Encouraged to elevate foot as much as possible. May take Lodine 500mg  twice a day as directed (patient has taken this before with good success). Limit walking and advance activity as tolerated. Follow-up in 7 to 10 days if not improving.   Final Clinical Impressions(s) / UC Diagnoses   Final diagnoses:  Contusion of right ankle, initial encounter  Acute right ankle pain     Discharge Instructions     Recommend use Lodine 500mg   twice a day as needed for pain and swelling. Keep foot elevated as much as possible. Limit walking and advance activity as tolerated. Follow-up in 7 to 10 days if not improving.     ED Prescriptions    Medication Sig Dispense Auth. Provider   etodolac (LODINE) 500 MG tablet Take 1 tablet (500 mg total) by mouth 2 (two) times daily as needed (for pain). 30 tablet Sudie Grumbling, NP     Controlled Substance Prescriptions [place] Controlled Substance Registry consulted? Not Applicable   Sudie Grumbling, NP 04/07/18 [phone]

## 2018-04-06 NOTE — ED Triage Notes (Signed)
Per patient x 3 days ago bed head fell on her right ankle. Per patient pain  When walking on right foot.

## 2018-04-07 NOTE — Progress Notes (Signed)
Left message stating lab results was normal and ok. If any questions please call

## 2018-04-09 ENCOUNTER — Other Ambulatory Visit: Payer: Self-pay

## 2018-04-09 ENCOUNTER — Telehealth: Payer: Self-pay | Admitting: Gastroenterology

## 2018-04-09 MED ORDER — NA SULFATE-K SULFATE-MG SULF 17.5-3.13-1.6 GM/177ML PO SOLN: 1.0000 | Freq: Once | ORAL | 0 refills | Status: AC

## 2018-04-09 NOTE — Telephone Encounter (Signed)
[name] with [facility] called & states patient needs her prep for the colonoscopy scheduled on 04-16-2018 with DR Allegra Lai called into [name] in [place]. (this in her son-in-law's pharmacy)

## 2018-04-09 NOTE — Telephone Encounter (Signed)
LVM to inform patient rx for Suprep has been sent  to [facility] in [place].  Thanks [employer]

## 2018-04-15 NOTE — Discharge Instructions (Signed)
General Anesthesia, Adult, Care After  This sheet gives you information about how to care for yourself after your procedure. Your health care provider may also give you more specific instructions. If you have problems or questions, contact your health care provider.  What can I expect after the procedure?  After the procedure, the following side effects are common:  Pain or discomfort at the IV site.  Nausea.  Vomiting.  Sore throat.  Trouble concentrating.  Feeling cold or chills.  Weak or tired.  Sleepiness and fatigue.  Soreness and body aches. These side effects can affect parts of the body that were not involved in surgery.  Follow these instructions at home:    For at least 24 hours after the procedure:  Have a responsible adult stay with you. It is important to have someone help care for you until you are awake and alert.  Rest as needed.  Do not:  Participate in activities in which you could fall or become injured.  Drive.  Use heavy machinery.  Drink alcohol.  Take sleeping pills or medicines that cause drowsiness.  Make important decisions or sign legal documents.  Take care of children on your own.  Eating and drinking  Follow any instructions from your health care provider about eating or drinking restrictions.  When you feel hungry, start by eating small amounts of foods that are soft and easy to digest (bland), such as toast. Gradually return to your regular diet.  Drink enough fluid to keep your urine pale yellow.  If you vomit, rehydrate by drinking water, juice, or clear broth.  General instructions  If you have sleep apnea, surgery and certain medicines can increase your risk for breathing problems. Follow instructions from your health care provider about wearing your sleep device:  Anytime you are sleeping, including during daytime naps.  While taking prescription pain medicines, sleeping medicines, or medicines that make you drowsy.  Return to your normal activities as told by your health care  provider. Ask your health care provider what activities are safe for you.  Take over-the-counter and prescription medicines only as told by your health care provider.  If you smoke, do not smoke without supervision.  Keep all follow-up visits as told by your health care provider. This is important.  Contact a health care provider if:  You have nausea or vomiting that does not get better with medicine.  You cannot eat or drink without vomiting.  You have pain that does not get better with medicine.  You are unable to pass urine.  You develop a skin rash.  You have a fever.  You have redness around your IV site that gets worse.  Get help right away if:  You have difficulty breathing.  You have chest pain.  You have blood in your urine or stool, or you vomit blood.  Summary  After the procedure, it is common to have a sore throat or nausea. It is also common to feel tired.  Have a responsible adult stay with you for the first 24 hours after general anesthesia. It is important to have someone help care for you until you are awake and alert.  When you feel hungry, start by eating small amounts of foods that are soft and easy to digest (bland), such as toast. Gradually return to your regular diet.  Drink enough fluid to keep your urine pale yellow.  Return to your normal activities as told by your health care provider. Ask your health care   provider what activities are safe for you.  This information is not intended to replace advice given to you by your health care provider. Make sure you discuss any questions you have with your health care provider.  Document Released: 05/28/2000 Document Revised: 10/05/2016 Document Reviewed: 10/05/2016  Elsevier Interactive Patient Education  2019 [employer]

## 2018-04-16 ENCOUNTER — Encounter
Admission: RE | Disposition: A | Discharge status: Home / Self Care | Payer: Self-pay | Source: Home / Self Care | Attending: Gastroenterology

## 2018-04-16 ENCOUNTER — Ambulatory Visit
Admission: RE | Admit: 2018-04-16 | Discharge: 2018-04-16 | Disposition: A | Discharge status: Home / Self Care | Payer: BLUE CROSS/BLUE SHIELD | Attending: Gastroenterology | Admitting: Gastroenterology

## 2018-04-16 ENCOUNTER — Ambulatory Visit: Payer: BLUE CROSS/BLUE SHIELD | Admitting: Anesthesiology

## 2018-04-16 DIAGNOSIS — Z8249 Family history of ischemic heart disease and other diseases of the circulatory system: Secondary | ICD-10-CM | POA: Diagnosis not present

## 2018-04-16 DIAGNOSIS — Z79890 Hormone replacement therapy (postmenopausal): Secondary | ICD-10-CM | POA: Diagnosis not present

## 2018-04-16 DIAGNOSIS — Z882 Allergy status to sulfonamides status: Secondary | ICD-10-CM | POA: Diagnosis not present

## 2018-04-16 DIAGNOSIS — Z791 Long term (current) use of non-steroidal anti-inflammatories (NSAID): Secondary | ICD-10-CM | POA: Insufficient documentation

## 2018-04-16 DIAGNOSIS — Z1211 Encounter for screening for malignant neoplasm of colon: Secondary | ICD-10-CM | POA: Insufficient documentation

## 2018-04-16 HISTORY — PX: COLONOSCOPY WITH PROPOFOL: SHX5780

## 2018-04-16 SURGERY — COLONOSCOPY WITH PROPOFOL
Anesthesia: General | Site: Rectum

## 2018-04-16 MED ORDER — LACTATED RINGERS IV SOLN
INTRAVENOUS | Status: DC | PRN
  Administered 2018-04-16: 09:00:00 via INTRAVENOUS

## 2018-04-16 MED ORDER — PROPOFOL 10 MG/ML IV BOLUS
INTRAVENOUS | Status: DC | PRN
  Administered 2018-04-16: 20 mg via INTRAVENOUS
  Administered 2018-04-16: 30 mg via INTRAVENOUS
  Administered 2018-04-16: 100 mg via INTRAVENOUS
  Administered 2018-04-16: 30 mg via INTRAVENOUS
  Administered 2018-04-16 (×2): 20 mg via INTRAVENOUS
  Administered 2018-04-16: 30 mg via INTRAVENOUS

## 2018-04-16 MED ORDER — STERILE WATER FOR IRRIGATION IR SOLN
Status: DC | PRN
  Administered 2018-04-16: 09:00:00

## 2018-04-16 MED ORDER — LIDOCAINE HCL (CARDIAC) PF 100 MG/5ML IV SOSY
PREFILLED_SYRINGE | INTRAVENOUS | Status: DC | PRN
  Administered 2018-04-16: 40 mg via INTRAVENOUS

## 2018-04-16 MED ORDER — SODIUM CHLORIDE 0.9 % IV SOLN: INTRAVENOUS | Status: DC

## 2018-04-16 MED ORDER — LACTATED RINGERS IV SOLN: INTRAVENOUS | Status: DC

## 2018-04-16 MED ORDER — OXYCODONE HCL 5 MG/5ML PO SOLN: 5.0000 mg | Freq: Once | ORAL | Status: DC | PRN

## 2018-04-16 MED ORDER — OXYCODONE HCL 5 MG PO TABS: 5.0000 mg | ORAL_TABLET | Freq: Once | ORAL | Status: DC | PRN

## 2018-04-16 SURGICAL SUPPLY — 5 items
CANISTER SUCT 1200ML W/VALVE (MISCELLANEOUS) ×2 IMPLANT
GOWN CVR UNV OPN BCK APRN NK (MISCELLANEOUS) ×2 IMPLANT
GOWN ISOL THUMB LOOP REG UNIV (MISCELLANEOUS) ×2
KIT ENDO PROCEDURE OLY (KITS) ×2 IMPLANT
WATER STERILE IRR 250ML POUR (IV SOLUTION) ×2 IMPLANT

## 2018-04-16 NOTE — Anesthesia Preprocedure Evaluation (Signed)
Anesthesia Evaluation  Patient identified by MRN, date of birth, ID band  Reviewed: NPO status   History of Anesthesia Complications Negative for: history of anesthetic complications  Airway Mallampati: II  TM Distance: >3 FB Neck ROM: full    Dental no notable dental hx.    Pulmonary neg pulmonary ROS,    Pulmonary exam normal        Cardiovascular Exercise Tolerance: Good Normal cardiovascular exam  Ef=45-50%   Neuro/Psych negative neurological ROS  negative psych ROS   GI/Hepatic negative GI ROS, Neg liver ROS,   Endo/Other  negative endocrine ROS  Renal/GU negative Renal ROS  negative genitourinary   Musculoskeletal   Abdominal   Peds  Hematology negative hematology ROS (+)   Anesthesia Other Findings Echo: 2016: MILD LV SYSTOLIC DYSFUNCTION (See above) NORMAL RIGHT VENTRICULAR SYSTOLIC FUNCTION MILD VALVULAR REGURGITATION  NO VALVULAR STENOSIS Mild global LV systolic dysfunction, EF 45-50%  Reproductive/Obstetrics                             Anesthesia Physical Anesthesia Plan  ASA: II  Anesthesia Plan: General   Post-op Pain Management:    Induction:   PONV Risk Score and Plan:   Airway Management Planned: Natural Airway  Additional Equipment:   Intra-op Plan:   Post-operative Plan:   Informed Consent: I have reviewed the patients History and Physical, chart, labs and discussed the procedure including the risks, benefits and alternatives for the proposed anesthesia with the patient or authorized representative who has indicated his/her understanding and acceptance.       Plan Discussed with: CRNA  Anesthesia Plan Comments:         Anesthesia Quick Evaluation

## 2018-04-16 NOTE — H&P (Signed)
Arlyss Repress, MD [address]  [address]  [place], [place] [address]  Main: [phone]  Fax: [phone] Pager: [phone]  Primary Care Physician:  Marguarite Arbour, MD Primary Gastroenterologist:  Dr. Arlyss Repress  Pre-Procedure History & Physical: HPI:  Carla Krause is a 58 y.o. female is here for an colonoscopy.   Past Medical History:  Diagnosis Date  . Abnormal Pap smear of cervix   . Family history of ovarian cancer     Past Surgical History:  Procedure Laterality Date  . CERVICAL BIOPSY  W/ LOOP ELECTRODE EXCISION      Prior to Admission medications   Medication Sig Start Date End Date Taking? Authorizing Provider  etodolac (LODINE) 500 MG tablet Take 1 tablet (500 mg total) by mouth 2 (two) times daily as needed (for pain). 04/06/18  Yes Amyot, Ali Lowe, NP  Multiple Vitamin (MULTIVITAMIN) tablet Take 1 tablet by mouth daily.   Yes [provider]  progesterone (PROMETRIUM) 100 MG capsule Take 100 mg by mouth daily.   Yes [provider]    Allergies as of 04/03/2018 - Review Complete 04/02/2018  Allergen Reaction Noted  . Sulfa antibiotics Rash 06/30/2014    Family History  Problem Relation Age of Onset  . Breast cancer Maternal Grandmother 60  . Diabetes Mellitus II Maternal Grandmother   . Ovarian cancer Maternal Grandmother        60s, no testing done  . Hypertension Mother   . Diabetes Mellitus II Father   . Hypertension Father   . Colon cancer Paternal Uncle        22s, no testing done  . Lung cancer Paternal Uncle   . Melanoma Brother     Social History   Socioeconomic History  . Marital status: Widowed    Spouse name: Not on file  . Number of children: Not on file  . Years of education: Not on file  . Highest education level: Not on file  Occupational History  . Not on file  Social Needs  . Financial resource strain: Not on file  . Food insecurity:    Worry: Not on file    Inability: Not on file  .  Transportation needs:    Medical: Not on file    Non-medical: Not on file  Tobacco Use  . Smoking status: Never Smoker  . Smokeless tobacco: Never Used  Substance and Sexual Activity  . Alcohol use: Yes    Comment: occasionally  . Drug use: No  . Sexual activity: Not Currently    Birth control/protection: Post-menopausal  Lifestyle  . Physical activity:    Days per week: Not on file    Minutes per session: Not on file  . Stress: Not on file  Relationships  . Social connections:    Talks on phone: Not on file    Gets together: Not on file    Attends religious service: Not on file    Active member of club or organization: Not on file    Attends meetings of clubs or organizations: Not on file    Relationship status: Not on file  . Intimate partner violence:    Fear of current or ex partner: Not on file    Emotionally abused: Not on file    Physically abused: Not on file    Forced sexual activity: Not on file  Other Topics Concern  . Not on file  Social History Narrative  . Not on file  Review of Systems: See HPI, otherwise negative ROS  Physical Exam: BP 130/78   Pulse 72   Temp 97.7 F (36.5 C) (Temporal)   Ht 5\' 7"  (1.702 m)   Wt 80.3 kg   SpO2 100%   BMI 27.72 kg/m  General:   Alert,  pleasant and cooperative in NAD Head:  Normocephalic and atraumatic. Neck:  Supple; no masses or thyromegaly. Lungs:  Clear throughout to auscultation.    Heart:  Regular rate and rhythm. Abdomen:  Soft, nontender and nondistended. Normal bowel sounds, without guarding, and without rebound.   Neurologic:  Alert and  oriented x4;  grossly normal neurologically.  Impression/Plan: Carla Krause is here for an colonoscopy to be performed for colon cancer screening  Risks, benefits, limitations, and alternatives regarding  colonoscopy have been reviewed with the patient.  Questions have been answered.  All parties agreeable.   Lannette Donath, MD  04/16/2018, 8:31 AM

## 2018-04-16 NOTE — Transfer of Care (Signed)
Immediate Anesthesia Transfer of Care Note  Patient: Carla Krause  Procedure(s) Performed: COLONOSCOPY WITH PROPOFOL (N/A Rectum)  Patient Location: PACU  Anesthesia Type: General  Level of Consciousness: awake, alert  and patient cooperative  Airway and Oxygen Therapy: Patient Spontanous Breathing and Patient connected to supplemental oxygen  Post-op Assessment: Post-op Vital signs reviewed, Patient's Cardiovascular Status Stable, Respiratory Function Stable, Patent Airway and No signs of Nausea or vomiting  Post-op Vital Signs: Reviewed and stable  Complications: No apparent anesthesia complications

## 2018-04-16 NOTE — Anesthesia Procedure Notes (Signed)
Procedure Name: MAC Date/Time: 04/16/2018 8:37 AM Performed by: Janna Arch, CRNA Pre-anesthesia Checklist: Patient identified, Emergency Drugs available, Suction available, Timeout performed and Patient being monitored Patient Re-evaluated:Patient Re-evaluated prior to induction Oxygen Delivery Method: Nasal cannula Placement Confirmation: positive ETCO2

## 2018-04-16 NOTE — Anesthesia Postprocedure Evaluation (Signed)
Anesthesia Post Note  Patient: Carla Krause  Procedure(s) Performed: COLONOSCOPY WITH PROPOFOL (N/A Rectum)  Patient location during evaluation: PACU Anesthesia Type: General Level of consciousness: awake and alert Pain management: pain level controlled Vital Signs Assessment: post-procedure vital signs reviewed and stable Respiratory status: spontaneous breathing, nonlabored ventilation, respiratory function stable and patient connected to nasal cannula oxygen Cardiovascular status: blood pressure returned to baseline and stable Postop Assessment: no apparent nausea or vomiting Anesthetic complications: no    Morton Simson

## 2018-04-16 NOTE — Op Note (Signed)
[facility] Patient Name: Carla Krause Procedure Date: 04/16/2018 7:55 AM MRN: [id number] Account #: [id number] Date of Birth: August 22, 1960 Admit Type: Outpatient Age: 58 Room: [facility] OR ROOM 01 Gender: Female Note Status: Finalized Procedure:            Colonoscopy Indications:          Screening for colorectal malignant neoplasm, This is                        the patient's first colonoscopy Providers:            Lin Landsman MD, MD Medicines:            Monitored Anesthesia Care Complications:        No immediate complications. Estimated blood loss: None. Procedure:            Pre-Anesthesia Assessment:                       - Prior to the procedure, a History and Physical was                        performed, and patient medications and allergies were                        reviewed. The patient is competent. The risks and                        benefits of the procedure and the sedation options and                        risks were discussed with the patient. All questions                        were answered and informed consent was obtained.                        Patient identification and proposed procedure were                        verified by the physician, the nurse, the                        anesthesiologist, the anesthetist and the technician in                        the pre-procedure area in the procedure room in the                        endoscopy suite. Mental Status Examination: alert and                        oriented. Airway Examination: normal oropharyngeal                        airway and neck mobility. Respiratory Examination:                        clear to auscultation. CV Examination: normal.  Prophylactic Antibiotics: The patient does not require                        prophylactic antibiotics. Prior Anticoagulants: The                        patient has taken no previous anticoagulant or                       antiplatelet agents. ASA Grade Assessment: II - A                        patient with mild systemic disease. After reviewing the                        risks and benefits, the patient was deemed in                        satisfactory condition to undergo the procedure. The                        anesthesia plan was to use monitored anesthesia care                        (MAC). Immediately prior to administration of                        medications, the patient was re-assessed for adequacy                        to receive sedatives. The heart rate, respiratory rate,                        oxygen saturations, blood pressure, adequacy of                        pulmonary ventilation, and response to care were                        monitored throughout the procedure. The physical status                        of the patient was re-assessed after the procedure.                       After obtaining informed consent, the colonoscope was                        passed under direct vision. Throughout the procedure,                        the patient's blood pressure, pulse, and oxygen                        saturations were monitored continuously. The                        Colonoscope was introduced through the anus and                        advanced to  the the cecum, identified by appendiceal                        orifice and ileocecal valve. The colonoscopy was                        somewhat difficult due to a tortuous colon. Successful                        completion of the procedure was aided by applying                        abdominal pressure. The patient tolerated the procedure                        fairly well. The quality of the bowel preparation was                        evaluated using the BBPS [facility] Bowel Preparation                        Scale) with scores of: Right Colon = 3, Transverse                        Colon = 3 and Left Colon = 3 (entire mucosa  seen well                        with no residual staining, small fragments of stool or                        opaque liquid). The total BBPS score equals 9. Findings:      The perianal and digital rectal examinations were normal. Pertinent       negatives include normal sphincter tone and no palpable rectal lesions.      The colon (entire examined portion) appeared normal.      The retroflexed view of the distal rectum and anal verge was normal and       showed no anal or rectal abnormalities. Impression:           - The entire examined colon is normal.                       - The distal rectum and anal verge are normal on                        retroflexion view.                       - No specimens collected. Recommendation:       - Discharge patient to home (with escort).                       - Resume previous diet today.                       - Continue present medications.                       - Repeat colonoscopy in 10 years for surveillance. Procedure Code(s):    --- Professional ---  Q9826, Colorectal cancer screening; colonoscopy on                        individual not meeting criteria for high risk Diagnosis Code(s):    --- Professional ---                       Z12.11, Encounter for screening for malignant neoplasm                        of colon CPT copyright 2018 American Medical Association. All rights reserved. The codes documented in this report are preliminary and upon coder review may  be revised to meet current compliance requirements. Dr. Ulyess Mort Lin Landsman MD, MD 04/16/2018 8:59:29 AM This report has been signed electronically. Number of Addenda: 0 Note Initiated On: 04/16/2018 7:55 AM Scope Withdrawal Time: 0 hours 9 minutes 18 seconds  Total Procedure Duration: 0 hours 14 minutes 9 seconds       [facility]

## 2018-04-17 ENCOUNTER — Encounter: Payer: Self-pay | Admitting: Gastroenterology

## 2018-04-23 ENCOUNTER — Encounter: Payer: Self-pay | Admitting: Obstetrics and Gynecology

## 2018-04-23 ENCOUNTER — Ambulatory Visit
Admission: RE | Admit: 2018-04-23 | Discharge: 2018-04-23 | Disposition: A | Discharge status: Home / Self Care | Payer: BLUE CROSS/BLUE SHIELD | Source: Ambulatory Visit | Attending: Obstetrics and Gynecology | Admitting: Obstetrics and Gynecology

## 2018-04-23 DIAGNOSIS — Z1239 Encounter for other screening for malignant neoplasm of breast: Secondary | ICD-10-CM

## 2018-04-23 DIAGNOSIS — Z1231 Encounter for screening mammogram for malignant neoplasm of breast: Secondary | ICD-10-CM | POA: Insufficient documentation

## 2018-04-28 ENCOUNTER — Other Ambulatory Visit: Payer: Self-pay | Admitting: Obstetrics and Gynecology

## 2018-04-28 MED ORDER — AMBULATORY NON FORMULARY MEDICATION: 11 refills | Status: DC

## 2018-04-28 NOTE — Progress Notes (Unsigned)
Rx change for progesterone 150 mg QHS (increased from 100 mg QHS). Rx faxed to [facility]. F/u pnr.

## 2018-05-04 DIAGNOSIS — Z1371 Encounter for nonprocreative screening for genetic disease carrier status: Secondary | ICD-10-CM

## 2018-05-04 HISTORY — DX: Encounter for nonprocreative screening for genetic disease carrier status: Z13.71

## 2018-05-08 ENCOUNTER — Telehealth: Payer: Self-pay

## 2018-05-08 NOTE — Telephone Encounter (Signed)
FYI, I have tried to contact Illa Level [phone]) regarding specimen results. I left her a message inquiring of status.   Original date of collection was 1/29. [id number]  See [facility] file if any questions regarding testing.   Joelene Millin CMA ([place])

## 2018-05-12 NOTE — Telephone Encounter (Signed)
Spoke with [facility]. Ins issues being taken care of, specimen released for testing.

## 2018-05-20 ENCOUNTER — Encounter: Payer: Self-pay | Admitting: Obstetrics and Gynecology

## 2018-05-26 ENCOUNTER — Telehealth: Payer: Self-pay | Admitting: Obstetrics and Gynecology

## 2018-05-26 NOTE — Telephone Encounter (Signed)
Pt aware of neg MyRisk results. Already had discussed results with Annia Friendly.  Patient understands these results only apply to her and her children, and this is not indicative of genetic testing results of her other family members. It is recommended that her other family members have genetic testing done.  Pt also understands negative genetic testing doesn't mean she will never get any of these cancers.   Hard copy mailed to pt. F/u prn.   Pt also with HRT questions. Has increased prog to 150 mg daily in last 2 wks, but still having sx. Suggested she give it 2 more wks. If still sx then, will add ERT.

## 2018-06-05 ENCOUNTER — Telehealth: Payer: Self-pay

## 2018-06-05 NOTE — Telephone Encounter (Signed)
Fax received from [facility] stating a prior authorization was needed through [employer].   I spoke to Leanna Sato, representative for [employer], gave him requested information and her states NO AUTHORIZATION REQUIRED for this patient.   Joelene Millin CMA ([place])

## 2018-08-07 ENCOUNTER — Telehealth: Payer: Self-pay | Admitting: Obstetrics and Gynecology

## 2018-08-07 NOTE — Telephone Encounter (Signed)
Received updated saliva testing results from Jim at [facility]. Pt on prog 150 mg orally. Has improved insomnia and sleeping well, but having sugar cravings and still has little energy. Eating lots of sugar daily, has cut back on exercise due to time constraints. Taking MVI and calcium/Mg/Vit D supp.  Suggested stopping all sugar since can be addictive. Increase exercise. Cont same HRT dose since probably won't make change in her sx. F/u prn.

## 2019-10-09 ENCOUNTER — Other Ambulatory Visit: Payer: Self-pay

## 2019-10-09 ENCOUNTER — Encounter: Payer: Self-pay | Admitting: Emergency Medicine

## 2019-10-09 ENCOUNTER — Ambulatory Visit: Payer: Self-pay

## 2019-10-09 ENCOUNTER — Ambulatory Visit
Admission: EM | Admit: 2019-10-09 | Discharge: 2019-10-09 | Disposition: A | Discharge status: Home / Self Care | Payer: BLUE CROSS/BLUE SHIELD | Attending: Family Medicine | Admitting: Family Medicine

## 2019-10-09 DIAGNOSIS — S61012D Laceration without foreign body of left thumb without damage to nail, subsequent encounter: Secondary | ICD-10-CM

## 2019-10-09 IMAGING — MG DIGITAL SCREENING BILATERAL MAMMOGRAM WITH TOMO AND CAD
8 series · 9 of 24 positions shown · non-contrast
Comparison: Previous exam(s).

CLINICAL DATA: Screening.

EXAM:
DIGITAL SCREENING BILATERAL MAMMOGRAM WITH TOMO AND CAD

[L MLO synth-2D]
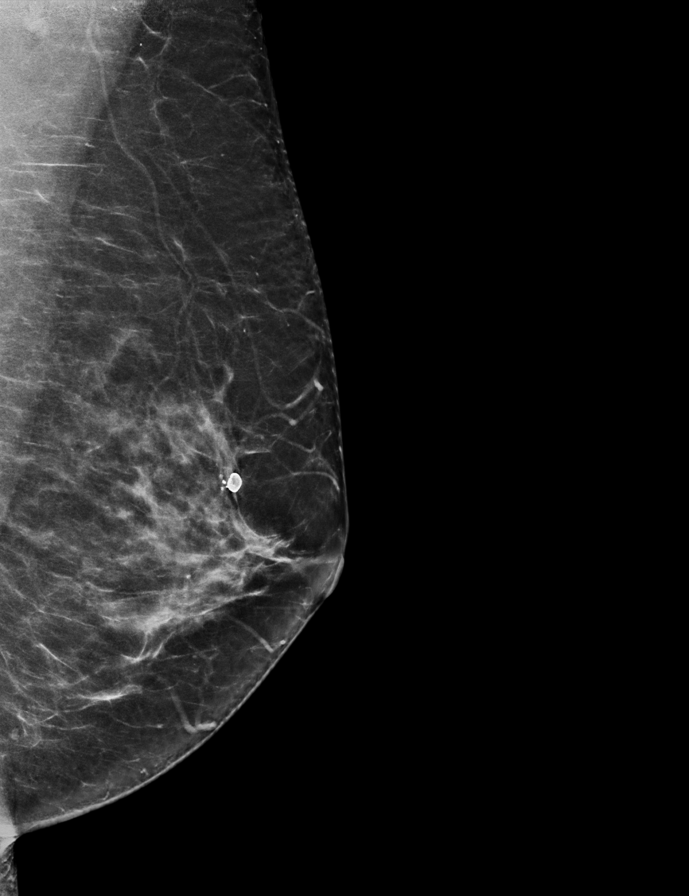

[L CC synth-2D]
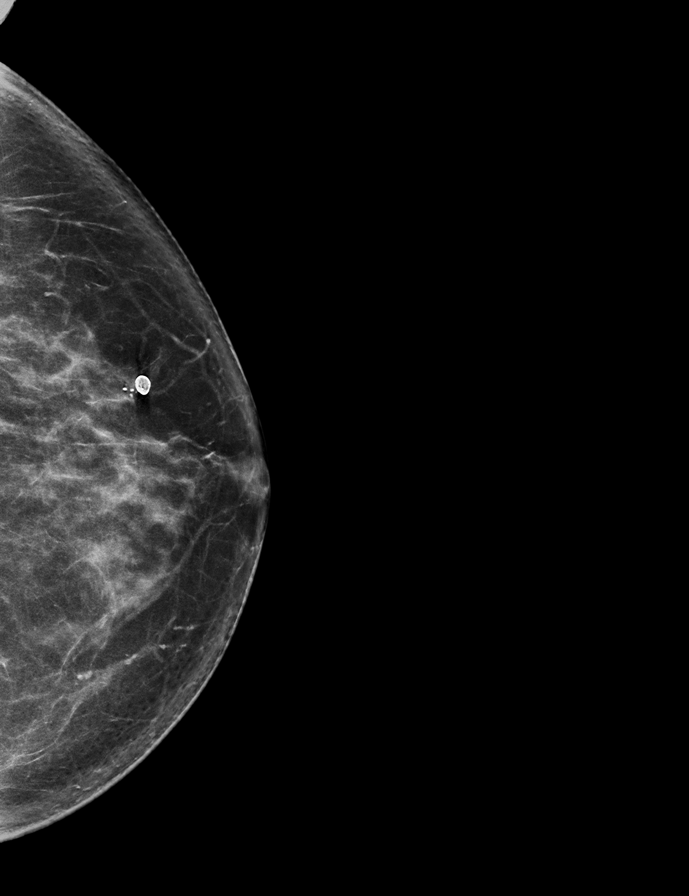

[R CC synth-2D]
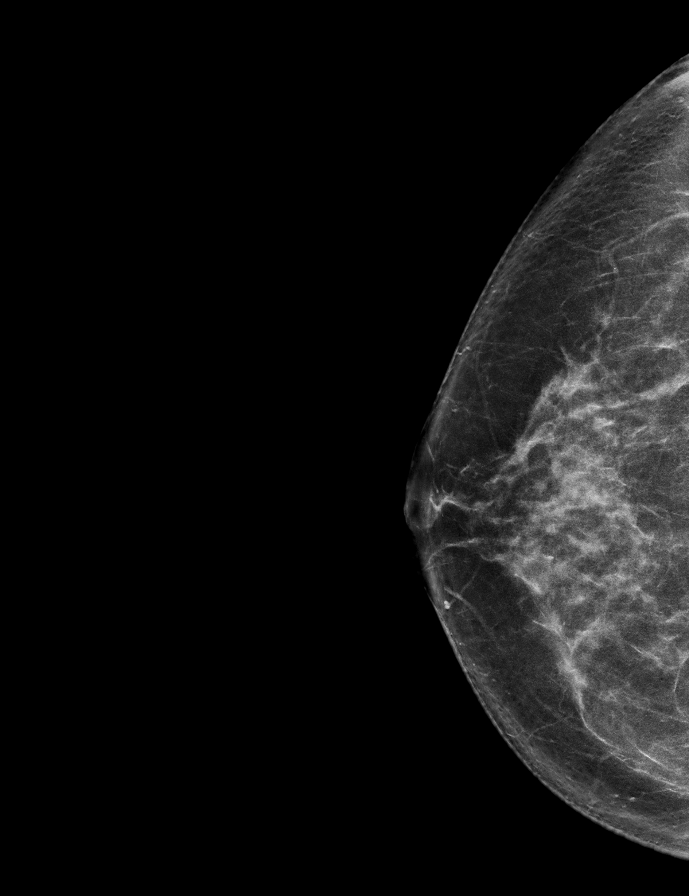

[R MLO synth-2D]
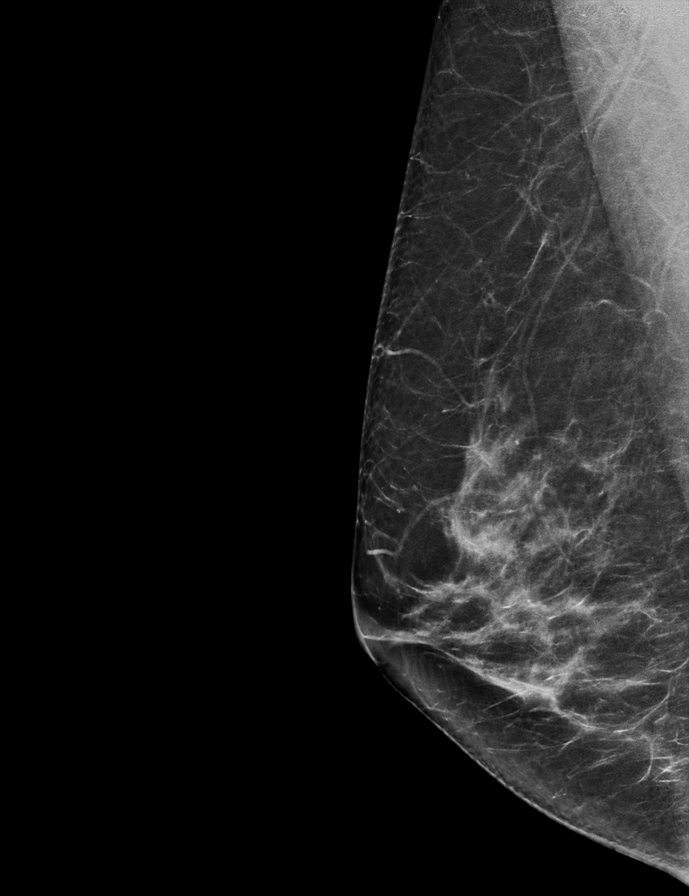

[L CC tomo · 2 of 74 frames shown]
[frame 24/74]
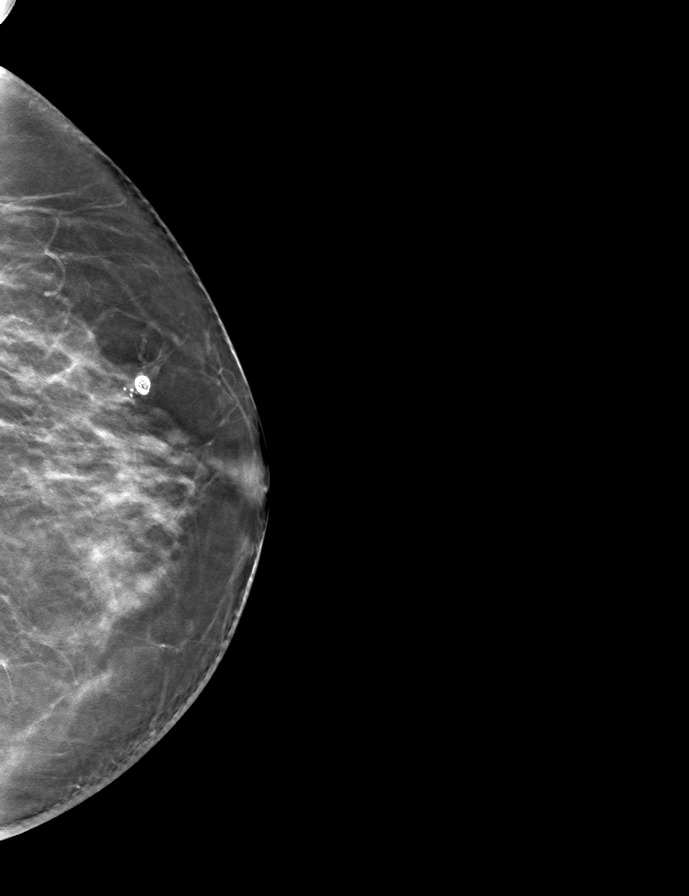
[frame 37/74]
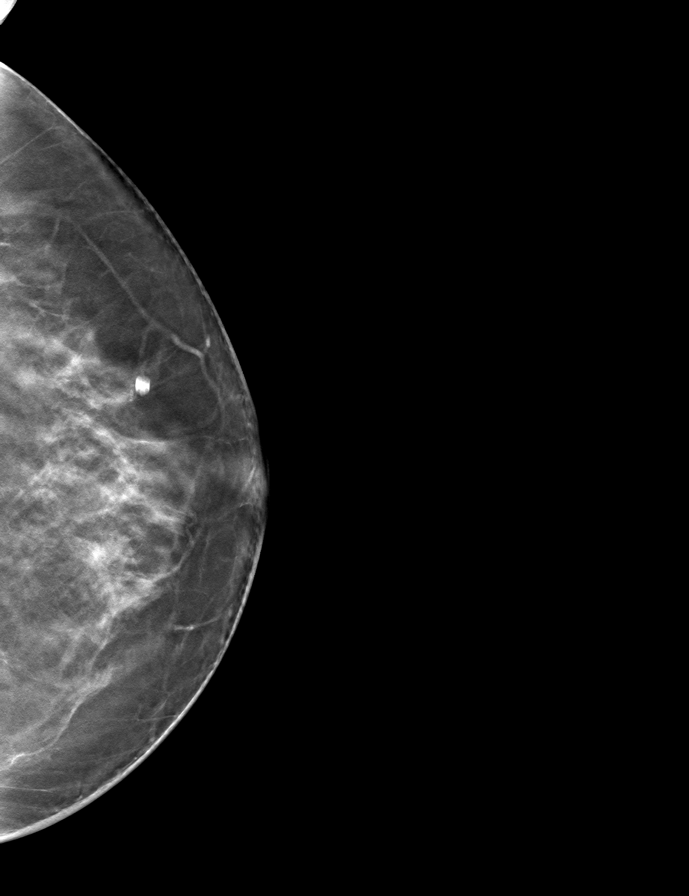

[L MLO tomo · tomo slice 35/68.0]
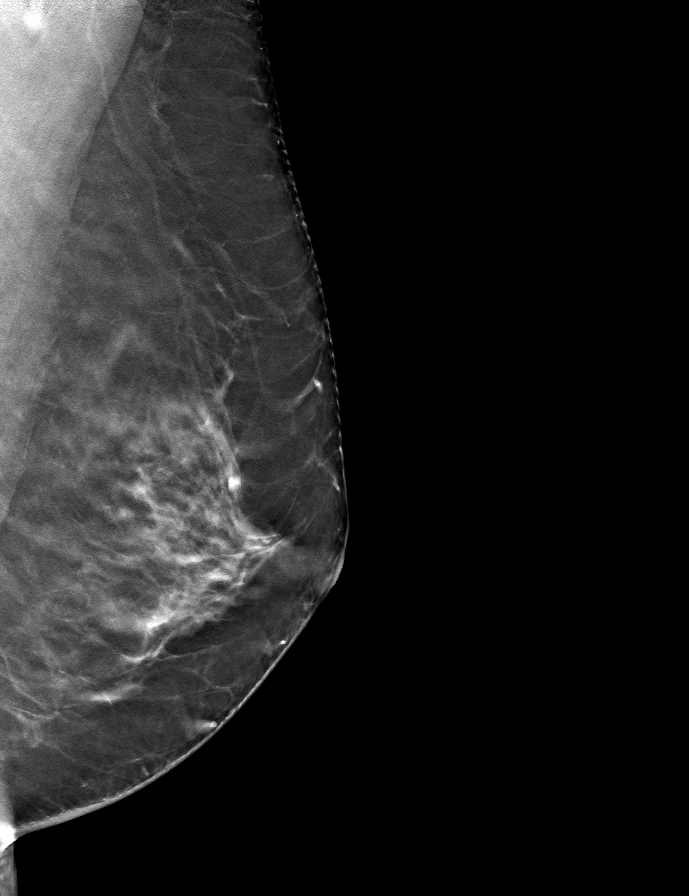

[R MLO tomo · tomo slice 35/70.0]
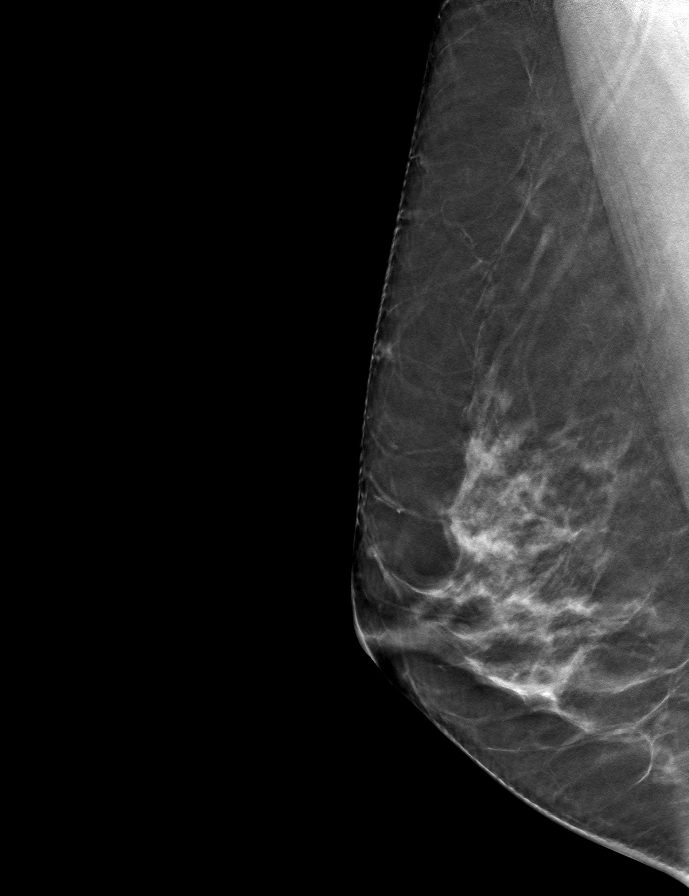

[R CC tomo · tomo slice 37/73.0]
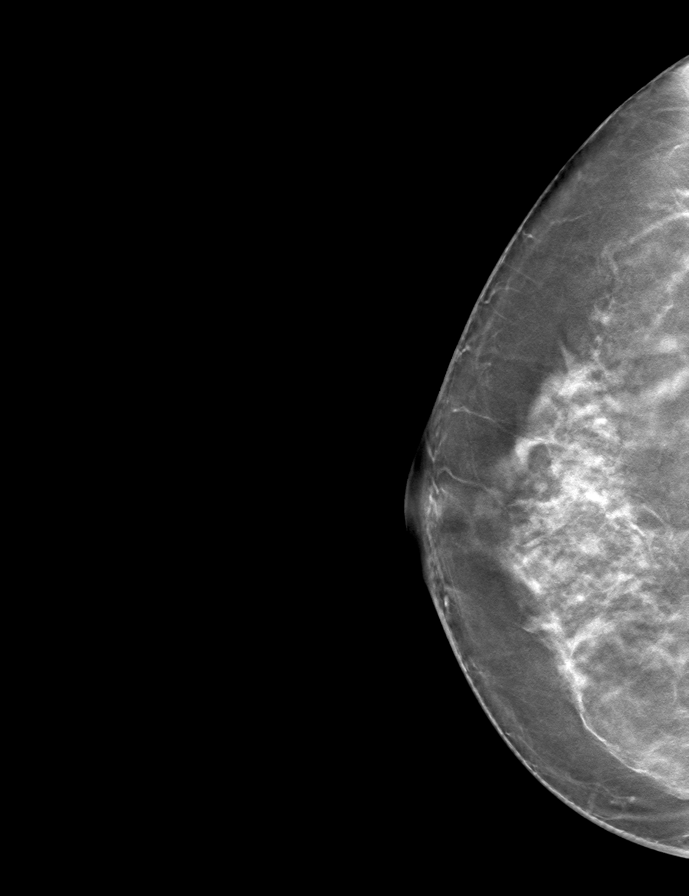

[9 of 24 positions shown; findings below may reference images not displayed]

ACR Breast Density Category c: The breast tissue is heterogeneously
dense, which may obscure small masses.
FINDINGS: There are no findings suspicious for malignancy. Images were
processed with CAD.
IMPRESSION: No mammographic evidence of malignancy. A result letter of this
screening mammogram will be mailed directly to the patient.

RECOMMENDATION:
Screening mammogram in one year. (Code:[id number]-U-LHB)

BI-RADS CATEGORY  1: Negative.

## 2019-10-09 NOTE — ED Provider Notes (Signed)
[facility]    CSN: [id number] Arrival date & time: 10/09/19  1147  History   Chief Complaint Chief Complaint  Patient presents with  . Finger Injury    left thumb   HPI  59 year old female presents with the above complaint.  Patient states that last week she cut her left thumb on a piece of glass.  He states that the laceration seems to have healed.  However, she has some bruising and her thumb is still tender to palpation.  She is concerned about retained glass and also is concerned about infection.  No fever.  No drainage.  No redness.  No relieving factors.  No other complaints.  Past Medical History:  Diagnosis Date  . Abnormal Pap smear of cervix   . BRCA negative 05/2018   MyRisk neg; IBIS=10.6%/riskscore=9.2%  . Family history of ovarian cancer     Patient Active Problem List   Diagnosis Date Noted  . Encounter for screening colonoscopy   . 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 Palpitations 07/19/2014  . Cardiomyopathy, idiopathic ([place]) 07/19/2014    Past Surgical History:  Procedure Laterality Date  . CERVICAL BIOPSY  W/ LOOP ELECTRODE EXCISION    . COLONOSCOPY WITH PROPOFOL N/A 04/16/2018   Procedure: COLONOSCOPY WITH PROPOFOL;  Surgeon: Lin Landsman, MD;  Location: [facility];  Service: Endoscopy;  Laterality: N/A;    OB History    Gravida  3   Para  3   Term  3   Preterm      AB      Living  3     SAB      TAB      Ectopic      Multiple      Live Births  3            Home Medications    Prior to Admission medications   Medication Sig Start Date End Date Taking? Authorizing Provider  etodolac (LODINE) 500 MG tablet Take 1 tablet (500 mg total) by mouth 2 (two) times daily as needed (for pain). 04/06/18  Yes Amyot, Nicholes Stairs, NP  Multiple Vitamin (MULTIVITAMIN) tablet Take 1 tablet by mouth daily.   Yes [provider]  progesterone (PROMETRIUM) 100 MG capsule Take 100 mg by mouth  daily.   Yes [provider]  AMBULATORY NON FORMULARY MEDICATION Medication Name: Progesterone 150 mg oral capsule Take 1 caps QHS 2/35/[birth year]   Copland, Deirdre Evener, PA-C    Family History Family History  Problem Relation Age of Onset  . Breast cancer Maternal Grandmother 60  . Diabetes Mellitus II Maternal Grandmother   . Ovarian cancer Maternal Grandmother        60s, no testing done  . Hypertension Mother   . Diabetes Mellitus II Father   . Hypertension Father   . Colon cancer Paternal Uncle        36s, no testing done  . Lung cancer Paternal Uncle   . Melanoma Brother     Social History Social History   Tobacco Use  . Smoking status: Never Smoker  . Smokeless tobacco: Never Used  Vaping Use  . Vaping Use: Never used  Substance Use Topics  . Alcohol use: Yes    Comment: occasionally  . Drug use: No     Allergies   Sulfa antibiotics   Review of Systems Review of Systems  Musculoskeletal:       Left thumb pain, recent injury.  Physical Exam Triage Vital Signs ED Triage Vitals  Enc Vitals Group     BP 10/09/19 1234 119/75     Pulse Rate 10/09/19 1234 73     Resp 10/09/19 1234 18     Temp 10/09/19 1234 98.1 F (36.7 C)     Temp Source 10/09/19 1234 Oral     SpO2 10/09/19 1234 99 %     Weight 10/09/19 1231 177 lb 0.5 oz (80.3 kg)     Height 10/09/19 1231 _0  (1.702 m)     Head Circumference --      Peak Flow --      Pain Score 10/09/19 1231 0     Pain Loc --      Pain Edu? --      Excl. in [place]? --    Updated Vital Signs BP 119/75 (BP Location: Right Arm)   Pulse 73   Temp 98.1 F (36.7 C) (Oral)   Resp 18   Ht _1  (1.702 m)   Wt 80.3 kg   SpO2 99%   BMI 27.73 kg/m   Visual Acuity Right Eye Distance:   Left Eye Distance:   Bilateral Distance:    Right Eye Near:   Left Eye Near:    Bilateral Near:     Physical Exam Vitals and nursing note reviewed.  Constitutional:      General: She is not in acute distress.    Appearance:  Normal appearance. She is not ill-appearing.  HENT:     Head: Normocephalic and atraumatic.  Skin:    Comments: Left thumb -linear laceration is healed.  There is some bruising to the palmar aspect of the thumb.  No redness.  No drainage.  Tender to palpation.  Neurological:     Mental Status: She is alert.  Psychiatric:        Mood and Affect: Mood normal.        Behavior: Behavior normal.    UC Treatments / Results  Labs (all labs ordered are listed, but only abnormal results are displayed) Labs Reviewed - No data to display  EKG   Radiology No results found.  Procedures Procedures (including critical care time)  Medications Ordered in UC Medications - No data to display  Initial Impression / Assessment and Plan / UC Course  I have reviewed the triage vital signs and the nursing notes.  Pertinent labs & imaging results that were available during my care of the patient were reviewed by me and considered in my medical decision making (see chart for details).    59 year old female presents with a laceration to her left thumb last week and associated bruising.  Laceration well-healed.  No evidence of infection.  No evidence of foreign body on exam.  Advised ibuprofen as needed.  Supportive care.  Final Clinical Impressions(s) / UC Diagnoses   Final diagnoses:  Laceration of left thumb without foreign body without damage to nail, subsequent encounter     Discharge Instructions     Ibuprofen as needed.  Keep it clean.  No evidence of foreign body. If this continues to bother you, see [facility] in [place].   ED Prescriptions    None     PDMP not reviewed this encounter.   Coral Spikes, DO 10/09/19 1310

## 2019-10-09 NOTE — ED Triage Notes (Signed)
Pt states she cut her finger on a piece of glass last week and states her thumb is still tender. She is concerned she has glass still in her thumb.

## 2019-10-09 NOTE — Discharge Instructions (Signed)
Ibuprofen as needed.  Keep it clean.  No evidence of foreign body. If this continues to bother you, see [facility] in [place].

## 2020-01-20 ENCOUNTER — Ambulatory Visit (INDEPENDENT_AMBULATORY_CARE_PROVIDER_SITE_OTHER): Payer: BLUE CROSS/BLUE SHIELD | Admitting: Obstetrics and Gynecology

## 2020-01-20 ENCOUNTER — Other Ambulatory Visit: Payer: Self-pay

## 2020-01-20 ENCOUNTER — Encounter: Payer: Self-pay | Admitting: Obstetrics and Gynecology

## 2020-01-20 VITALS — BP 110/80 | Ht 67.0 in | Wt 179.0 lb

## 2020-01-20 DIAGNOSIS — N951 Menopausal and female climacteric states: Secondary | ICD-10-CM

## 2020-01-20 DIAGNOSIS — Z1151 Encounter for screening for human papillomavirus (HPV): Secondary | ICD-10-CM

## 2020-01-20 DIAGNOSIS — Z01419 Encounter for gynecological examination (general) (routine) without abnormal findings: Secondary | ICD-10-CM

## 2020-01-20 DIAGNOSIS — Z124 Encounter for screening for malignant neoplasm of cervix: Secondary | ICD-10-CM

## 2020-01-20 DIAGNOSIS — Z13220 Encounter for screening for lipoid disorders: Secondary | ICD-10-CM

## 2020-01-20 DIAGNOSIS — N879 Dysplasia of cervix uteri, unspecified: Secondary | ICD-10-CM | POA: Diagnosis not present

## 2020-01-20 DIAGNOSIS — Z1231 Encounter for screening mammogram for malignant neoplasm of breast: Secondary | ICD-10-CM

## 2020-01-20 DIAGNOSIS — Z0000 Encounter for general adult medical examination without abnormal findings: Secondary | ICD-10-CM

## 2020-01-20 DIAGNOSIS — Z79890 Hormone replacement therapy (postmenopausal): Secondary | ICD-10-CM

## 2020-01-20 MED ORDER — AMBULATORY NON FORMULARY MEDICATION: 11 refills | Status: DC

## 2020-01-20 NOTE — Progress Notes (Signed)
PCP: Idelle Crouch, MD   Chief Complaint  Patient presents with  . Gynecologic Exam    HPI:      [name] is a 59 y.o. [phone] who LMP was No LMP recorded. Patient is postmenopausal., presents today for her annual examination.  Her menses are absent due to menopause. She does not have postmenopausal bleeding.  She had vasomotor sx, improved with prog 150 mg QHS (working with Ashby Dawes at [place]);  also had insomnia, wt gain, and memory issues. Insomnia improved with prog. Changed from cream to caps 2 yrs ago with better sx control. Pt ran out of Rx 2 months ago. Has been doing fine off HRT so far. Would like Rx RF in case needs to restart.   Sex activity: not sexually active. She does not have vaginal dryness.  Last Pap: February 07, 2017  Results were: no abnormalities /neg HPV DNA.  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 There is a FH of ovarian cancer in her MGM, borther with melanoma. Pt is MyRisk neg 2020, MyRisk neg; IBIS=10.6%/riskscore=9.2%. The patient does do self-breast exams.  Colonoscopy: 2/20 without abnormalities; repeat due after 10 yrs. FH colon cancer in pat uncle   Tobacco use: The patient denies current or previous tobacco use. Alcohol use: social drinker  No drug use Exercise: min active  She does get adequate calcium but not enough Vitamin D in her diet.  Labs normal 1/20 except mildly elevated LDL, due for repeat today.  Past Medical History:  Diagnosis Date  . Abnormal Pap smear of cervix   . BRCA negative 05/2018   MyRisk neg; IBIS=10.6%/riskscore=9.2%  . Family history of ovarian cancer     Past Surgical History:  Procedure Laterality Date  . CERVICAL BIOPSY  W/ LOOP ELECTRODE EXCISION    . COLONOSCOPY WITH PROPOFOL N/A 04/16/2018   Procedure: COLONOSCOPY WITH PROPOFOL;  Surgeon: Lin Landsman, MD;  Location: [facility];  Service: Endoscopy;  Laterality: N/A;    Family History  Problem Relation Age of Onset  . Breast cancer Maternal Grandmother 60  . Diabetes Mellitus II Maternal Grandmother   . Ovarian cancer Maternal Grandmother        60s, no testing done  . Hypertension Mother   . Diabetes Mellitus II Father   . Hypertension Father   . Colon cancer Paternal Uncle        64s, no testing done  . Lung cancer Paternal Uncle   . Melanoma Brother     Social History   Socioeconomic History  . Marital status: Widowed    Spouse name: Not on file  . Number of children: Not on file  . Years of education: Not on file  . Highest education level: Not on file  Occupational History  . Not on file  Tobacco Use  . Smoking status: Never Smoker  . Smokeless tobacco: Never Used  Vaping Use  . Vaping Use: Never used  Substance and Sexual Activity  . Alcohol use: Yes    Comment: occasionally  . Drug use: No  . Sexual activity: Not Currently    Birth control/protection: Post-menopausal  Other Topics Concern  . Not on file  Social History Narrative  . Not on file   Social Determinants of Health   Financial Resource Strain:   . Difficulty of Paying Living Expenses:  Not on file  Food Insecurity:   . Worried About Charity fundraiser in the Last Year: Not on file  . Ran Out of Food in the Last Year: Not on file  Transportation Needs:   . Lack of Transportation (Medical): Not on file  . Lack of Transportation (Non-Medical): Not on file  Physical Activity:   . Days of Exercise per Week: Not on file  . Minutes of Exercise per Session: Not on file  Stress:   . Feeling of Stress : Not on file  Social Connections:   . Frequency of Communication with Friends and Family: Not on file  . Frequency of Social Gatherings with Friends and Family: Not on file  . Attends Religious Services: Not on file  . Active Member of Clubs or Organizations: Not on file  . Attends Archivist  Meetings: Not on file  . Marital Status: Not on file  Intimate Partner Violence:   . Fear of Current or Ex-Partner: Not on file  . Emotionally Abused: Not on file  . Physically Abused: Not on file  . Sexually Abused: Not on file    Outpatient Medications Prior to Visit  Medication Sig Dispense Refill  . etodolac (LODINE) 500 MG tablet Take 1 tablet (500 mg total) by mouth 2 (two) times daily as needed (for pain). 30 tablet 0  . Multiple Vitamin (MULTIVITAMIN) tablet Take 1 tablet by mouth daily.    . progesterone (PROMETRIUM) 100 MG capsule Take 100 mg by mouth daily. (Patient not taking: Reported on 01/20/2020)    . AMBULATORY NON FORMULARY MEDICATION Medication Name: Progesterone 150 mg oral capsule Take 1 caps QHS (Patient not taking: Reported on 01/20/2020) 30 capsule 11   No facility-administered medications prior to visit.     ROS:  Review of Systems  Constitutional: Negative for fatigue, fever and unexpected weight change.  Respiratory: Negative for cough, shortness of breath and wheezing.   Cardiovascular: Negative for chest pain, palpitations and leg swelling.  Gastrointestinal: Negative for blood in stool, constipation, diarrhea, nausea and vomiting.  Endocrine: Negative for cold intolerance, heat intolerance and polyuria.  Genitourinary: Negative for dyspareunia, dysuria, flank pain, frequency, genital sores, hematuria, menstrual problem, pelvic pain, urgency, vaginal bleeding, vaginal discharge and vaginal pain.  Musculoskeletal: Negative for back pain, joint swelling and myalgias.  Skin: Negative for rash.  Neurological: Negative for dizziness, syncope, light-headedness, numbness and headaches.  Hematological: Negative for adenopathy.  Psychiatric/Behavioral: Negative for agitation, confusion, sleep disturbance and suicidal ideas. The patient is not nervous/anxious.   BREAST: No symptoms    Objective: BP 110/80   Ht 5' 7" (1.702 m)   Wt 179 lb (81.2 kg)   BMI  28.04 kg/m    Physical Exam Constitutional:      Appearance: She is well-developed.  Genitourinary:     Vulva, vagina, cervix, uterus, right adnexa and left adnexa normal.     No vulval lesion or tenderness noted.     No vaginal discharge, erythema or tenderness.     No cervical polyp.     Uterus is not enlarged or tender.     No right or left adnexal mass present.     Right adnexa not tender.     Left adnexa not tender.  Rectum:     Guaiac result negative.     No tenderness.  Neck:     Thyroid: No thyromegaly.  Cardiovascular:     Rate and Rhythm: Normal rate and regular rhythm.  Heart sounds: Normal heart sounds. No murmur heard.   Pulmonary:     Effort: Pulmonary effort is normal.     Breath sounds: Normal breath sounds.  Chest:     Breasts:        Right: No mass, nipple discharge, skin change or tenderness.        Left: No mass, nipple discharge, skin change or tenderness.  Abdominal:     Palpations: Abdomen is soft.     Tenderness: There is no abdominal tenderness. There is no guarding.  Musculoskeletal:        General: Normal range of motion.     Cervical back: Normal range of motion.  Neurological:     General: No focal deficit present.     Mental Status: She is alert and oriented to person, place, and time.     Cranial Nerves: No cranial nerve deficit.  Skin:    General: Skin is warm and dry.  Psychiatric:        Mood and Affect: Mood normal.        Behavior: Behavior normal.        Thought Content: Thought content normal.        Judgment: Judgment normal.  Vitals reviewed.     Assessment/Plan: Encounter for annual routine gynecological examination  Cervical cancer screening - Plan: IGP, Aptima HPV  Screening for HPV (human papillomavirus) - Plan: IGP, Aptima HPV  Cervical dysplasia - Plan: IGP, Aptima HPV  Encounter for screening mammogram for malignant neoplasm of breast - Plan: MM 3D SCREEN BREAST BILATERAL; pt to sched mammo  Vasomotor  symptoms due to menopause - Plan: AMBULATORY NON FORMULARY MEDICATION; Rx RF prog 150 mg sent to [facility]. May not need to restart if sx stay manageable. F/u prn.   Hormone replacement therapy (HRT) - Plan: AMBULATORY NON FORMULARY MEDICATION  Blood tests for routine general physical examination - Plan: Comprehensive metabolic panel, Lipid panel  Screening cholesterol level - Plan: Lipid panel   Meds ordered this encounter  Medications  . AMBULATORY NON FORMULARY MEDICATION    Sig: Medication Name: Progesterone 150 mg oral capsule Take 1 caps QHS 6 night on, 1 night off    Dispense:  30 capsule    Refill:  11    Order Specific Question:   Supervising Provider    Answer:   Gae Dry [[id number]]           GYN counsel mammography screening, menopause, adequate intake of calcium and vitamin D, diet and exercise    F/U  Return in about 1 year (around 01/19/2021).  Chris Narasimhan B. Rayann Jolley, PA-C 01/20/2020 10:12 AM

## 2020-01-20 NOTE — Patient Instructions (Signed)
I value your feedback and entrusting us with your care. If you get a [facility] patient survey, I would appreciate you taking the time to let us know about your experience today. Thank you!  As of February 12, 2019, your lab results will be released to your MyChart immediately, before I even have a chance to see them. Please give me time to review them and contact you if there are any abnormalities. Thank you for your patience.  

## 2020-01-21 LAB — LIPID PANEL
Chol/HDL Ratio: 3.6 ratio (ref 0.0–4.4)
Cholesterol, Total: 233 mg/dL — ABNORMAL HIGH (ref 100–199)
HDL: 65 mg/dL (ref >39)
LDL Chol Calc (NIH): 151 mg/dL — ABNORMAL HIGH (ref 0–99)
Triglycerides: 98 mg/dL (ref 0–149)
VLDL Cholesterol Cal: 17 mg/dL (ref 5–40)

## 2020-01-21 LAB — COMPREHENSIVE METABOLIC PANEL
ALT: 22 IU/L (ref 0–32)
AST: 19 IU/L (ref 0–40)
Albumin/Globulin Ratio: 2.1 (ref 1.2–2.2)
Albumin: 4.8 g/dL (ref 3.8–4.9)
Alkaline Phosphatase: 89 IU/L (ref 44–121)
BUN/Creatinine Ratio: 18 (ref 9–23)
BUN: 16 mg/dL (ref 6–24)
Bilirubin Total: 0.4 mg/dL (ref 0.0–1.2)
CO2: 19 mmol/L — ABNORMAL LOW (ref 20–29)
Calcium: 9.9 mg/dL (ref 8.7–10.2)
Chloride: 103 mmol/L (ref 96–106)
Creatinine, Ser: 0.91 mg/dL (ref 0.57–1.00)
GFR calc Af Amer: 81 mL/min/{1.73_m2} (ref >59)
GFR calc non Af Amer: 70 mL/min/{1.73_m2} (ref >59)
Globulin, Total: 2.3 g/dL (ref 1.5–4.5)
Glucose: 79 mg/dL (ref 65–99)
Potassium: 4.7 mmol/L (ref 3.5–5.2)
Sodium: 140 mmol/L (ref 134–144)
Total Protein: 7.1 g/dL (ref 6.0–8.5)

## 2020-01-26 LAB — IGP, APTIMA HPV: HPV Aptima: NEGATIVE

## 2020-03-22 ENCOUNTER — Encounter: Payer: Self-pay | Admitting: Obstetrics and Gynecology

## 2020-04-25 ENCOUNTER — Other Ambulatory Visit: Payer: Self-pay

## 2020-04-25 ENCOUNTER — Ambulatory Visit
Admission: RE | Admit: 2020-04-25 | Discharge: 2020-04-25 | Disposition: A | Discharge status: Home / Self Care | Payer: BLUE CROSS/BLUE SHIELD | Source: Ambulatory Visit | Attending: Family Medicine | Admitting: Family Medicine

## 2020-04-25 VITALS — BP 114/81 | HR 79 | Temp 97.9°F | Resp 17

## 2020-04-25 DIAGNOSIS — H6981 Other specified disorders of Eustachian tube, right ear: Secondary | ICD-10-CM

## 2020-04-25 MED ORDER — PSEUDOEPHEDRINE HCL ER 120 MG PO TB12: 120.0000 mg | ORAL_TABLET | Freq: Two times a day (BID) | ORAL | 0 refills | Status: DC

## 2020-04-25 MED ORDER — FLUTICASONE PROPIONATE 50 MCG/ACT NA SUSP: 2.0000 | Freq: Every day | NASAL | 0 refills | Status: DC

## 2020-04-25 NOTE — ED Triage Notes (Signed)
Pt is present today with ear fullness in the right ear. Pt states she has dizziness and head full associated with the ear fullness. Pt state that her sx started last Tuesday. Pt states that she tried to take Augmentin Tuesday and noticed no change.

## 2020-04-25 NOTE — Discharge Instructions (Signed)
If does not resolve, call [facility].  Take care  Dr. Adriana Simas

## 2020-04-25 NOTE — ED Provider Notes (Signed)
[facility]    CSN: [id number] Arrival date & time: 04/25/20  1648      History   Chief Complaint Chief Complaint  Patient presents with  . Ear Fullness   HPI  60 year old female presents with the above complaint.  Ear fullness (right) since last Tuesday. Ear feels full and stopped up. No significant pain. No fever. No discharge. She has taken Augmentin without relief.  No other respiratory symptoms.  No other complaints at this time.  Past Medical History:  Diagnosis Date  . Abnormal Pap smear of cervix   . BRCA negative 05/2018   MyRisk neg; IBIS=10.6%/riskscore=9.2%  . Family history of ovarian cancer     Patient Active Problem List   Diagnosis Date Noted  . Encounter for screening colonoscopy   . 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 Palpitations 07/19/2014  . Cardiomyopathy, idiopathic ([place]) 07/19/2014    Past Surgical History:  Procedure Laterality Date  . CERVICAL BIOPSY  W/ LOOP ELECTRODE EXCISION    . COLONOSCOPY WITH PROPOFOL N/A 04/16/2018   Procedure: COLONOSCOPY WITH PROPOFOL;  Surgeon: Lin Landsman, MD;  Location: [facility];  Service: Endoscopy;  Laterality: N/A;    OB History    Gravida  3   Para  3   Term  3   Preterm      AB      Living  3     SAB      IAB      Ectopic      Multiple      Live Births  3            Home Medications    Prior to Admission medications   Medication Sig Start Date End Date Taking? Authorizing Provider  fluticasone (FLONASE) 50 MCG/ACT nasal spray Place 2 sprays into both nostrils daily. 04/25/20  Yes Azarion Hove G, DO  pseudoephedrine (SUDAFED 12 HOUR) 120 MG 12 hr tablet Take 1 tablet (120 mg total) by mouth 2 (two) times daily. 04/25/20  Yes Keyari Kleeman G, DO  AMBULATORY NON FORMULARY MEDICATION Medication Name: Progesterone 150 mg oral capsule Take 1 caps QHS 6 night on, 1 night off 92/44/62   Copland, Alicia B, PA-C  etodolac (LODINE) 500 MG tablet  Take 1 tablet (500 mg total) by mouth 2 (two) times daily as needed (for pain). 04/06/18   Katy Apo, NP  Multiple Vitamin (MULTIVITAMIN) tablet Take 1 tablet by mouth daily.    [provider]  progesterone (PROMETRIUM) 100 MG capsule Take 100 mg by mouth daily. Patient not taking: Reported on 01/20/2020    [provider]    Family History Family History  Problem Relation Age of Onset  . Breast cancer Maternal Grandmother 60  . Diabetes Mellitus II Maternal Grandmother   . Ovarian cancer Maternal Grandmother        60s, no testing done  . Hypertension Mother   . Diabetes Mellitus II Father   . Hypertension Father   . Colon cancer Paternal Uncle        51s, no testing done  . Lung cancer Paternal Uncle   . Melanoma Brother     Social History Social History   Tobacco Use  . Smoking status: Never Smoker  . Smokeless tobacco: Never Used  Vaping Use  . Vaping Use: Never used  Substance Use Topics  . Alcohol use: Yes    Comment: occasionally  . Drug use: No  Allergies   Sulfa antibiotics   Review of Systems Review of Systems  Constitutional: Negative.   HENT:       Ear stopped up.   Physical Exam Triage Vital Signs ED Triage Vitals  Enc Vitals Group     BP 04/25/20 1718 114/81     Pulse Rate 04/25/20 1718 79     Resp 04/25/20 1718 17     Temp 04/25/20 1722 97.9 F (36.6 C)     Temp Source 04/25/20 1718 Oral     SpO2 04/25/20 1718 100 %     Weight --      Height --      Head Circumference --      Peak Flow --      Pain Score 04/25/20 1721 0     Pain Loc --      Pain Edu? --      Excl. in [place]? --    Updated Vital Signs BP 114/81 (BP Location: Left Arm)   Pulse 79   Temp 97.9 F (36.6 C)   Resp 17   SpO2 100%   Visual Acuity Right Eye Distance:   Left Eye Distance:   Bilateral Distance:    Right Eye Near:   Left Eye Near:    Bilateral Near:     Physical Exam Vitals and nursing note reviewed.  Constitutional:       General: She is not in acute distress.    Appearance: Normal appearance. She is not ill-appearing.  HENT:     Head: Normocephalic and atraumatic.     Right Ear: Tympanic membrane normal.     Left Ear: Tympanic membrane normal.  Eyes:     General:        Right eye: No discharge.        Left eye: No discharge.     Conjunctiva/sclera: Conjunctivae normal.  Cardiovascular:     Rate and Rhythm: Normal rate and regular rhythm.  Pulmonary:     Effort: Pulmonary effort is normal.     Breath sounds: Normal breath sounds. No wheezing, rhonchi or rales.  Neurological:     Mental Status: She is alert.  Psychiatric:        Mood and Affect: Mood normal.        Behavior: Behavior normal.    UC Treatments / Results  Labs (all labs ordered are listed, but only abnormal results are displayed) Labs Reviewed - No data to display  EKG   Radiology No results found.  Procedures Procedures (including critical care time)  Medications Ordered in UC Medications - No data to display  Initial Impression / Assessment and Plan / UC Course  I have reviewed the triage vital signs and the nursing notes.  Pertinent labs & imaging results that were available during my care of the patient were reviewed by me and considered in my medical decision making (see chart for details).    60 year old female presents with eustachian tube dysfunction. Treating with sudafed & flonase.   Final Clinical Impressions(s) / UC Diagnoses   Final diagnoses:  Dysfunction of right eustachian tube     Discharge Instructions     If does not resolve, call [facility].  Take care  Dr. Lacinda Axon    ED Prescriptions    Medication Sig Dispense Auth. Provider   pseudoephedrine (SUDAFED 12 HOUR) 120 MG 12 hr tablet Take 1 tablet (120 mg total) by mouth 2 (two) times daily. 30 tablet Aptos, [place],  DO   fluticasone (FLONASE) 50 MCG/ACT nasal spray Place 2 sprays into both nostrils daily. 16 g Coral Spikes, DO     PDMP  not reviewed this encounter.   Coral Spikes, [place] 04/25/20 1843

## 2020-05-25 ENCOUNTER — Encounter: Payer: Self-pay | Admitting: Obstetrics and Gynecology

## 2021-07-10 ENCOUNTER — Ambulatory Visit (INDEPENDENT_AMBULATORY_CARE_PROVIDER_SITE_OTHER): Payer: 59

## 2021-07-10 ENCOUNTER — Encounter: Payer: Self-pay | Admitting: Emergency Medicine

## 2021-07-10 ENCOUNTER — Other Ambulatory Visit: Payer: Self-pay

## 2021-07-10 ENCOUNTER — Ambulatory Visit
Admission: EM | Admit: 2021-07-10 | Discharge: 2021-07-10 | Disposition: A | Discharge status: Home / Self Care | Payer: 59 | Attending: Internal Medicine | Admitting: Internal Medicine

## 2021-07-10 DIAGNOSIS — R059 Cough, unspecified: Secondary | ICD-10-CM

## 2021-07-10 DIAGNOSIS — R0602 Shortness of breath: Secondary | ICD-10-CM | POA: Diagnosis not present

## 2021-07-10 DIAGNOSIS — J208 Acute bronchitis due to other specified organisms: Secondary | ICD-10-CM

## 2021-07-10 MED ORDER — BENZONATATE 200 MG PO CAPS: 200.0000 mg | ORAL_CAPSULE | Freq: Two times a day (BID) | ORAL | 0 refills | Status: DC | PRN

## 2021-07-10 MED ORDER — GUAIFENESIN-CODEINE 100-10 MG/5ML PO SOLN: 10.0000 mL | Freq: Every evening | ORAL | 0 refills | Status: DC | PRN

## 2021-07-10 NOTE — ED Triage Notes (Signed)
Pt c/o cough. Started over a week ago. She states she has had a cold also, but has gotten better. She states the cough is keeping her up at night.  ?

## 2021-07-10 NOTE — ED Provider Notes (Signed)
[facility]    CSN: [id number] Arrival date & time: 07/10/21  0946      History   Chief Complaint Chief Complaint  Patient presents with   Appointment   Cough    HPI Carla Krause is a 61 y.o. female cough x 7 days. Started with a non productive cough, then for a few days has rhinitis and felt like a cold, but those symptoms are gone. In the past 2 days the cough has gotten worse and been having cough fits, and could not sleep last night. Today feels fatigued, her appetite is down. She did a covid test at home and was neg. Denies fever, chills or sweats. Today the cough seems a little more productive, but drank alcohol with honey to help her cough. She did not look at the mucous.  Talking provokes a tickle and makes her cough.    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   Palpitations 07/19/2014   Cardiomyopathy, idiopathic ([facility]) 07/19/2014    Past Surgical History:  Procedure Laterality Date   CERVICAL BIOPSY  W/ LOOP ELECTRODE EXCISION     COLONOSCOPY WITH PROPOFOL N/A 04/16/2018   Procedure: COLONOSCOPY WITH PROPOFOL;  Surgeon: Toney Reil, MD;  Location: [facility];  Service: Endoscopy;  Laterality: N/A;    OB History     Gravida  3   Para  3   Term  3   Preterm      AB      Living  3      SAB      IAB      Ectopic      Multiple      Live Births  3            Home Medications    Prior to Admission medications   Medication Sig Start Date End Date Taking? Authorizing Provider  benzonatate (TESSALON) 200 MG capsule Take 1 capsule (200 mg total) by mouth 2 (two) times daily as needed for cough. 07/10/21  Yes Rodriguez-Southworth, Nettie Elm, PA-C   etodolac (LODINE) 500 MG tablet Take 1 tablet (500 mg total) by mouth 2 (two) times daily as needed (for pain). 04/06/18  Yes Amyot, Ali Lowe, NP  guaiFENesin-codeine 100-10 MG/5ML syrup Take 10 mLs by mouth at bedtime as needed for cough. 07/10/21  Yes Rodriguez-Southworth, Nettie Elm, PA-C  Multiple Vitamin (MULTIVITAMIN) tablet Take 1 tablet by mouth daily.   Yes [provider]  AMBULATORY NON FORMULARY MEDICATION Medication Name: Progesterone 150 mg oral capsule Take 1 caps QHS 6 night on, 1 night off 01/20/20   Copland, Ilona Sorrel, PA-C    Family History Family History  Problem Relation Age of Onset   Breast cancer Maternal Grandmother 60   Diabetes Mellitus II Maternal Grandmother    Ovarian cancer Maternal Grandmother        21s, no testing done   Hypertension Mother    Diabetes Mellitus II Father    Hypertension Father    Colon cancer Paternal Uncle        63s, no testing done   Lung cancer Paternal Uncle    Melanoma Brother     Social History Social  History   Tobacco Use   Smoking status: Never   Smokeless tobacco: Never  Vaping Use   Vaping Use: Never used  Substance Use Topics   Alcohol use: Yes    Comment: occasionally   Drug use: No     Allergies   Sulfa antibiotics   Review of Systems Review of Systems  Constitutional:  Positive for fatigue. Negative for appetite change, chills, diaphoresis and fever.  HENT:  Positive for postnasal drip. Negative for congestion, rhinorrhea, sneezing and sore throat.   Eyes:  Negative for discharge.  Respiratory:  Positive for cough. Negative for shortness of breath and wheezing.   Musculoskeletal:  Negative for myalgias.  Neurological:  Positive for headaches.    Physical Exam Triage Vital Signs ED Triage Vitals  Enc Vitals Group     BP 07/10/21 1000 127/80     Pulse Rate 07/10/21 1000 73     Resp 07/10/21 1000 18     Temp 07/10/21 1000 98.2 F (36.8 C)     Temp Source 07/10/21 1000 Oral     SpO2 07/10/21  1000 95 %     Weight 07/10/21 0957 179 lb 0.2 oz (81.2 kg)     Height 07/10/21 0957 5\' 7"  (1.702 m)     Head Circumference --      Peak Flow --      Pain Score 07/10/21 0957 2     Pain Loc --      Pain Edu? --      Excl. in [place]? --    No data found.  Updated Vital Signs BP 127/80 (BP Location: Right Arm)   Pulse 73   Temp 98.2 F (36.8 C) (Oral)   Resp 18   Ht 5\' 7"  (1.702 m)   Wt 179 lb 0.2 oz (81.2 kg)   SpO2 95%   BMI 28.04 kg/m   Visual Acuity Right Eye Distance:   Left Eye Distance:   Bilateral Distance:    Right Eye Near:   Left Eye Near:    Bilateral Near:      Repeated pulse ox 98% Physical Exam Vitals signs and nursing note reviewed.  Constitutional:      General: She is not in acute distress.    Appearance: Normal appearance. She is not ill-appearing, toxic-appearing or diaphoretic.  HENT:     Head: Normocephalic.     Right Ear: Tympanic membrane, ear canal and external ear normal.     Left Ear: Tympanic membrane, ear canal and external ear normal.     Nose: Nose normal.     Mouth/Throat:     Mouth: Mucous membranes are moist.  Eyes:     General: No scleral icterus.       Right eye: No discharge.        Left eye: No discharge.     Conjunctiva/sclera: Conjunctivae normal.  Neck:     Musculoskeletal: Neck supple. No neck rigidity.  Cardiovascular:     Rate and Rhythm: Normal rate and regular rhythm.     Heart sounds: No murmur.  Pulmonary:     Effort: Pulmonary effort is normal.     Breath sounds: Normal breath sounds.  Musculoskeletal: Normal range of motion.  Lymphadenopathy:     Cervical: No cervical adenopathy.  Skin:    General: Skin is warm and dry.     Coloration: Skin is not jaundiced.     Findings: No rash.  Neurological:     Mental Status: She is  alert and oriented to person, place, and time.     Gait: Gait normal.  Psychiatric:        Mood and Affect: Mood normal.        Behavior: Behavior normal.        Thought Content:  Thought content normal.        Judgment: Judgment normal.   UC Treatments / Results  Labs (all labs ordered are listed, but only abnormal results are displayed) Labs Reviewed - No data to display  EKG   Radiology DG Chest 2 View  Result Date: 07/10/2021 CLINICAL DATA:  Cough.  Decreased oxygen saturation. EXAM: CHEST - 2 VIEW COMPARISON:  None Available. FINDINGS: Heart size and mediastinal contours are unremarkable. There is no pleural effusion or edema. No airspace opacities identified. The visualized osseous structures are notable for a mild thoracic scoliosis deformity which is convex towards the right. IMPRESSION: No acute cardiopulmonary abnormalities. Electronically Signed   By: Signa Kell M.D.   On: 07/10/2021 10:32    Procedures Procedures (including critical care time)  Medications Ordered in UC Medications - No data to display  Initial Impression / Assessment and Plan / UC Course  I have reviewed the triage vital signs and the nursing notes.  Pertinent imaging results that were available during my care of the patient were reviewed by me and considered in my medical decision making (see chart for details).  She has viral bronchitis with bronchospasms She declined a covid test and inhaler.  I placed hr on Tessalon and Robitusin AC  See instructions.  Final Clinical Impressions(s) / UC Diagnoses   Final diagnoses:  Viral bronchitis     Discharge Instructions      You have viral bronchitis, and bronchospasms that are causing the cough attacks Antibiotics will not hep this. If you get worse after 10 days and develop a fever of 100.5 or more, then you may have a bacterial infection And need to be seen, but you can try virtual urgent care visit.     ED Prescriptions     Medication Sig Dispense Auth. Provider   benzonatate (TESSALON) 200 MG capsule Take 1 capsule (200 mg total) by mouth 2 (two) times daily as needed for cough. 30 capsule  Rodriguez-Southworth, Admiral Marcucci, PA-C   guaiFENesin-codeine 100-10 MG/5ML syrup Take 10 mLs by mouth at bedtime as needed for cough. 120 mL Rodriguez-Southworth, Nettie Elm, PA-C      I have reviewed the PDMP during this encounter.   Garey Ham, [place] 07/10/21 1115

## 2021-07-10 NOTE — Discharge Instructions (Addendum)
You have viral bronchitis, and bronchospasms that are causing the cough attacks ?Antibiotics will not hep this. ?If you get worse after 10 days and develop a fever of 100.5 or more, then you may have a bacterial infection ?And need to be seen, but you can try virtual urgent care visit.  ?

## 2022-06-04 NOTE — Progress Notes (Unsigned)
PCP: Idelle Crouch, MD   No chief complaint on file.   HPI:      [name] is a 62 y.o. [phone] who LMP was No LMP recorded. Patient is postmenopausal., presents today for her annual examination.  Her menses are absent due to menopause. She does not have postmenopausal bleeding.  She had vasomotor sx, improved with prog 150 mg QHS (working with Ashby Dawes at [facility]);  also had insomnia, wt gain, and memory issues. Insomnia improved with prog. Changed from cream to caps 2 yrs ago with better sx control. Pt ran out of Rx 2 months ago. Has been doing fine off HRT so far. Would like Rx RF in case needs to restart.   Sex activity: not sexually active. She does not have vaginal dryness.  Last Pap: 01/20/20 Results were: no abnormalities /neg HPV DNA.  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 There is a FH of ovarian cancer in her MGM, borther with melanoma. Pt is MyRisk neg 2020, MyRisk neg; IBIS=10.6%/riskscore=9.2%. The patient does do self-breast exams.  Colonoscopy: 2/20 without abnormalities; repeat due after 10 yrs. FH colon cancer in pat uncle   Tobacco use: The patient denies current or previous tobacco use. Alcohol use: social drinker  No drug use Exercise: min active  She does get adequate calcium but not enough Vitamin D in her diet.  Labs normal 1/20 except mildly elevated LDL, due for repeat today.  Past Medical History:  Diagnosis Date   Abnormal Pap smear of cervix    BRCA negative 05/2018   MyRisk neg; IBIS=10.6%/riskscore=9.2%   Family history of ovarian cancer     Past Surgical History:  Procedure Laterality Date   CERVICAL BIOPSY  W/ LOOP ELECTRODE EXCISION     COLONOSCOPY WITH PROPOFOL N/A 04/16/2018   Procedure: COLONOSCOPY WITH PROPOFOL;  Surgeon: Lin Landsman, MD;  Location: [facility];  Service: Endoscopy;  Laterality:  N/A;    Family History  Problem Relation Age of Onset   Breast cancer Maternal Grandmother 60   Diabetes Mellitus II Maternal Grandmother    Ovarian cancer Maternal Grandmother        90s, no testing done   Hypertension Mother    Diabetes Mellitus II Father    Hypertension Father    Colon cancer Paternal Uncle        44s, no testing done   Lung cancer Paternal Uncle    Melanoma Brother     Social History   Socioeconomic History   Marital status: Widowed    Spouse name: Not on file   Number of children: Not on file   Years of education: Not on file   Highest education level: Not on file  Occupational History   Not on file  Tobacco Use   Smoking status: Never   Smokeless tobacco: Never  Vaping Use   Vaping Use: Never used  Substance and Sexual Activity   Alcohol use: Yes    Comment: occasionally   Drug use: No   Sexual activity: Not Currently    Birth control/protection: Post-menopausal  Other Topics Concern   Not on file  Social History Narrative   Not on file   Social Determinants of Health   Financial Resource Strain: Not on file  Food Insecurity: Not on file  Transportation Needs: Not on file  Physical Activity: Not  on file  Stress: Not on file  Social Connections: Not on file  Intimate Partner Violence: Not on file    Outpatient Medications Prior to Visit  Medication Sig Dispense Refill   AMBULATORY NON FORMULARY MEDICATION Medication Name: Progesterone 150 mg oral capsule Take 1 caps QHS 6 night on, 1 night off 30 capsule 11   benzonatate (TESSALON) 200 MG capsule Take 1 capsule (200 mg total) by mouth 2 (two) times daily as needed for cough. 30 capsule 0   etodolac (LODINE) 500 MG tablet Take 1 tablet (500 mg total) by mouth 2 (two) times daily as needed (for pain). 30 tablet 0   guaiFENesin-codeine 100-10 MG/5ML syrup Take 10 mLs by mouth at bedtime as needed for cough. 120 mL 0   Multiple Vitamin (MULTIVITAMIN) tablet Take 1 tablet by mouth daily.      No facility-administered medications prior to visit.     ROS:  Review of Systems  Constitutional:  Negative for fatigue, fever and unexpected weight change.  Respiratory:  Negative for cough, shortness of breath and wheezing.   Cardiovascular:  Negative for chest pain, palpitations and leg swelling.  Gastrointestinal:  Negative for blood in stool, constipation, diarrhea, nausea and vomiting.  Endocrine: Negative for cold intolerance, heat intolerance and polyuria.  Genitourinary:  Negative for dyspareunia, dysuria, flank pain, frequency, genital sores, hematuria, menstrual problem, pelvic pain, urgency, vaginal bleeding, vaginal discharge and vaginal pain.  Musculoskeletal:  Negative for back pain, joint swelling and myalgias.  Skin:  Negative for rash.  Neurological:  Negative for dizziness, syncope, light-headedness, numbness and headaches.  Hematological:  Negative for adenopathy.  Psychiatric/Behavioral:  Negative for agitation, confusion, sleep disturbance and suicidal ideas. The patient is not nervous/anxious.   BREAST: No symptoms    Objective: There were no vitals taken for this visit.   Physical Exam Constitutional:      Appearance: She is well-developed.  Genitourinary:     Vulva normal.     No vaginal discharge, erythema or tenderness.      Right Adnexa: not tender and no mass present.    Left Adnexa: not tender and no mass present.    No cervical polyp.     Uterus is not enlarged or tender.  Rectum:     Guaiac result negative.     No tenderness.  Breasts:    Right: No mass, nipple discharge, skin change or tenderness.     Left: No mass, nipple discharge, skin change or tenderness.  Neck:     Thyroid: No thyromegaly.  Cardiovascular:     Rate and Rhythm: Normal rate and regular rhythm.     Heart sounds: Normal heart sounds. No murmur heard. Pulmonary:     Effort: Pulmonary effort is normal.     Breath sounds: Normal breath sounds.  Abdominal:      Palpations: Abdomen is soft.     Tenderness: There is no abdominal tenderness. There is no guarding.  Musculoskeletal:        General: Normal range of motion.     Cervical back: Normal range of motion.  Neurological:     General: No focal deficit present.     Mental Status: She is alert and oriented to person, place, and time.     Cranial Nerves: No cranial nerve deficit.  Skin:    General: Skin is warm and dry.  Psychiatric:        Mood and Affect: Mood normal.        Behavior:  Behavior normal.        Thought Content: Thought content normal.        Judgment: Judgment normal.  Vitals reviewed.     Assessment/Plan: Encounter for annual routine gynecological examination  Cervical cancer screening - Plan: IGP, Aptima HPV  Screening for HPV (human papillomavirus) - Plan: IGP, Aptima HPV  Cervical dysplasia - Plan: IGP, Aptima HPV  Encounter for screening mammogram for malignant neoplasm of breast - Plan: MM 3D SCREEN BREAST BILATERAL; pt to sched mammo  Vasomotor symptoms due to menopause - Plan: AMBULATORY NON FORMULARY MEDICATION; Rx RF prog 150 mg sent to [facility]. May not need to restart if sx stay manageable. F/u prn.   Hormone replacement therapy (HRT) - Plan: AMBULATORY NON FORMULARY MEDICATION  Blood tests for routine general physical examination - Plan: Comprehensive metabolic panel, Lipid panel  Screening cholesterol level - Plan: Lipid panel   No orders of the defined types were placed in this encounter.          GYN counsel mammography screening, menopause, adequate intake of calcium and vitamin D, diet and exercise    F/U  No follow-ups on file.  Carla Reihl B. Cara Aguino, PA-C 06/04/2022 4:53 PM

## 2022-06-05 ENCOUNTER — Encounter: Payer: Self-pay | Admitting: Obstetrics and Gynecology

## 2022-06-05 ENCOUNTER — Other Ambulatory Visit (HOSPITAL_COMMUNITY)
Admission: RE | Admit: 2022-06-05 | Discharge: 2022-06-05 | Disposition: A | Discharge status: Home / Self Care | Payer: 59 | Source: Ambulatory Visit | Attending: Obstetrics and Gynecology | Admitting: Obstetrics and Gynecology

## 2022-06-05 ENCOUNTER — Ambulatory Visit (INDEPENDENT_AMBULATORY_CARE_PROVIDER_SITE_OTHER): Payer: 59 | Admitting: Obstetrics and Gynecology

## 2022-06-05 VITALS — BP 124/70 | Ht 67.0 in | Wt 194.0 lb

## 2022-06-05 DIAGNOSIS — Z0000 Encounter for general adult medical examination without abnormal findings: Secondary | ICD-10-CM | POA: Diagnosis not present

## 2022-06-05 DIAGNOSIS — Z01419 Encounter for gynecological examination (general) (routine) without abnormal findings: Secondary | ICD-10-CM

## 2022-06-05 DIAGNOSIS — Z131 Encounter for screening for diabetes mellitus: Secondary | ICD-10-CM

## 2022-06-05 DIAGNOSIS — E785 Hyperlipidemia, unspecified: Secondary | ICD-10-CM | POA: Diagnosis not present

## 2022-06-05 DIAGNOSIS — Z1151 Encounter for screening for human papillomavirus (HPV): Secondary | ICD-10-CM | POA: Insufficient documentation

## 2022-06-05 DIAGNOSIS — R899 Unspecified abnormal finding in specimens from other organs, systems and tissues: Secondary | ICD-10-CM

## 2022-06-05 DIAGNOSIS — Z1329 Encounter for screening for other suspected endocrine disorder: Secondary | ICD-10-CM

## 2022-06-05 DIAGNOSIS — R635 Abnormal weight gain: Secondary | ICD-10-CM

## 2022-06-05 DIAGNOSIS — Z6830 Body mass index (BMI) 30.0-30.9, adult: Secondary | ICD-10-CM | POA: Diagnosis not present

## 2022-06-05 DIAGNOSIS — Z1231 Encounter for screening mammogram for malignant neoplasm of breast: Secondary | ICD-10-CM

## 2022-06-05 DIAGNOSIS — Z124 Encounter for screening for malignant neoplasm of cervix: Secondary | ICD-10-CM | POA: Diagnosis not present

## 2022-06-05 NOTE — Patient Instructions (Signed)
I value your feedback and you entrusting us with your care. If you get a [facility] patient survey, I would appreciate you taking the time to let us know about your experience today. Thank you!  [facility]: [phone]      

## 2022-06-06 LAB — HEMOGLOBIN A1C
Est. average glucose Bld gHb Est-mCnc: 108 mg/dL
Hgb A1c MFr Bld: 5.4 % (ref 4.8–5.6)

## 2022-06-06 LAB — CYTOLOGY - PAP
Comment: NEGATIVE
Diagnosis: NEGATIVE
High risk HPV: NEGATIVE

## 2022-06-06 LAB — COMPREHENSIVE METABOLIC PANEL
ALT: 23 IU/L (ref 0–32)
AST: 18 IU/L (ref 0–40)
Albumin/Globulin Ratio: 1.9 (ref 1.2–2.2)
Albumin: 4.6 g/dL (ref 3.8–4.9)
Alkaline Phosphatase: 98 IU/L (ref 44–121)
BUN/Creatinine Ratio: 20 (ref 9–23)
BUN: 19 mg/dL (ref 6–24)
Bilirubin Total: 0.3 mg/dL (ref 0.0–1.2)
CO2: 22 mmol/L (ref 20–29)
Calcium: 10.8 mg/dL — ABNORMAL HIGH (ref 8.7–10.2)
Chloride: 105 mmol/L (ref 96–106)
Creatinine, Ser: 0.93 mg/dL (ref 0.57–1.00)
Globulin, Total: 2.4 g/dL (ref 1.5–4.5)
Glucose: 88 mg/dL (ref 70–99)
Potassium: 4.6 mmol/L (ref 3.5–5.2)
Sodium: 141 mmol/L (ref 134–144)
Total Protein: 7 g/dL (ref 6.0–8.5)
eGFR: 71 mL/min/{1.73_m2} (ref >59)

## 2022-06-06 LAB — TSH+FREE T4
Free T4: 1.4 ng/dL (ref 0.82–1.77)
TSH: 1.16 u[IU]/mL (ref 0.450–4.500)

## 2022-06-06 LAB — LIPID PANEL
Chol/HDL Ratio: 4 ratio (ref 0.0–4.4)
Cholesterol, Total: 236 mg/dL — ABNORMAL HIGH (ref 100–199)
HDL: 59 mg/dL (ref >39)
LDL Chol Calc (NIH): 153 mg/dL — ABNORMAL HIGH (ref 0–99)
Triglycerides: 137 mg/dL (ref 0–149)
VLDL Cholesterol Cal: 24 mg/dL (ref 5–40)

## 2022-06-07 NOTE — Addendum Note (Signed)
Addended by: Ardeth Perfect B on: [id number] 04:55 PM   Modules accepted: Orders

## 2022-06-22 ENCOUNTER — Other Ambulatory Visit: Payer: Self-pay | Admitting: Obstetrics and Gynecology

## 2022-06-22 ENCOUNTER — Encounter: Payer: Self-pay | Admitting: Obstetrics and Gynecology

## 2022-06-22 DIAGNOSIS — N951 Menopausal and female climacteric states: Secondary | ICD-10-CM

## 2022-06-22 DIAGNOSIS — Z780 Asymptomatic menopausal state: Secondary | ICD-10-CM

## 2022-06-22 DIAGNOSIS — R899 Unspecified abnormal finding in specimens from other organs, systems and tissues: Secondary | ICD-10-CM

## 2022-06-22 DIAGNOSIS — Z0000 Encounter for general adult medical examination without abnormal findings: Secondary | ICD-10-CM

## 2022-06-22 DIAGNOSIS — Z1321 Encounter for screening for nutritional disorder: Secondary | ICD-10-CM

## 2022-06-22 NOTE — Progress Notes (Signed)
Lab orders recommended by Elenor Quinones at [facility]. Pt consulted her for sx.

## 2022-06-26 ENCOUNTER — Other Ambulatory Visit: Payer: 59

## 2022-06-26 DIAGNOSIS — Z1321 Encounter for screening for nutritional disorder: Secondary | ICD-10-CM

## 2022-06-26 DIAGNOSIS — Z780 Asymptomatic menopausal state: Secondary | ICD-10-CM | POA: Diagnosis not present

## 2022-06-26 DIAGNOSIS — N951 Menopausal and female climacteric states: Secondary | ICD-10-CM | POA: Diagnosis not present

## 2022-06-26 DIAGNOSIS — R899 Unspecified abnormal finding in specimens from other organs, systems and tissues: Secondary | ICD-10-CM

## 2022-06-26 DIAGNOSIS — Z0000 Encounter for general adult medical examination without abnormal findings: Secondary | ICD-10-CM | POA: Diagnosis not present

## 2022-06-29 ENCOUNTER — Ambulatory Visit
Admission: RE | Admit: 2022-06-29 | Discharge: 2022-06-29 | Disposition: A | Discharge status: Home / Self Care | Payer: 59 | Source: Ambulatory Visit | Attending: Obstetrics and Gynecology | Admitting: Obstetrics and Gynecology

## 2022-06-29 DIAGNOSIS — Z1231 Encounter for screening mammogram for malignant neoplasm of breast: Secondary | ICD-10-CM | POA: Insufficient documentation

## 2022-06-30 LAB — COMPREHENSIVE METABOLIC PANEL
ALT: 18 IU/L (ref 0–32)
AST: 17 IU/L (ref 0–40)
Albumin/Globulin Ratio: 2.5 — ABNORMAL HIGH (ref 1.2–2.2)
Albumin: 4.8 g/dL (ref 3.8–4.9)
Alkaline Phosphatase: 88 IU/L (ref 44–121)
BUN/Creatinine Ratio: 22 (ref 9–23)
BUN: 20 mg/dL (ref 6–24)
Bilirubin Total: 0.3 mg/dL (ref 0.0–1.2)
CO2: 20 mmol/L (ref 20–29)
Calcium: 10 mg/dL (ref 8.7–10.2)
Chloride: 104 mmol/L (ref 96–106)
Creatinine, Ser: 0.93 mg/dL (ref 0.57–1.00)
Globulin, Total: 1.9 g/dL (ref 1.5–4.5)
Glucose: 76 mg/dL (ref 70–99)
Potassium: 4.4 mmol/L (ref 3.5–5.2)
Sodium: 139 mmol/L (ref 134–144)
Total Protein: 6.7 g/dL (ref 6.0–8.5)
eGFR: 71 mL/min/{1.73_m2} (ref >59)

## 2022-06-30 LAB — CORTISOL: Cortisol: 17.4 ug/dL (ref 6.2–19.4)

## 2022-06-30 LAB — INSULIN, RANDOM: INSULIN: 13.2 u[IU]/mL (ref 2.6–24.9)

## 2022-06-30 LAB — VITAMIN D 25 HYDROXY (VIT D DEFICIENCY, FRACTURES): Vit D, 25-Hydroxy: 72.5 ng/mL (ref 30.0–100.0)

## 2022-06-30 LAB — PROGESTERONE: Progesterone: 0.2 ng/mL

## 2022-06-30 LAB — ESTROGENS, TOTAL: Estrogen: 146 pg/mL (ref 40–244)

## 2022-07-01 ENCOUNTER — Encounter: Payer: Self-pay | Admitting: Obstetrics and Gynecology

## 2022-07-02 ENCOUNTER — Other Ambulatory Visit: Payer: Self-pay | Admitting: *Deleted

## 2022-07-02 ENCOUNTER — Inpatient Hospital Stay
Admission: RE | Admit: 2022-07-02 | Discharge: 2022-07-02 | Disposition: A | Discharge status: Home / Self Care | Payer: Self-pay | Source: Ambulatory Visit | Attending: Internal Medicine | Admitting: Internal Medicine

## 2022-07-02 DIAGNOSIS — Z1231 Encounter for screening mammogram for malignant neoplasm of breast: Secondary | ICD-10-CM

## 2022-07-03 ENCOUNTER — Other Ambulatory Visit: Payer: Self-pay | Admitting: Obstetrics and Gynecology

## 2022-07-03 DIAGNOSIS — R928 Other abnormal and inconclusive findings on diagnostic imaging of breast: Secondary | ICD-10-CM

## 2022-07-03 DIAGNOSIS — N6489 Other specified disorders of breast: Secondary | ICD-10-CM

## 2022-07-03 DIAGNOSIS — N63 Unspecified lump in unspecified breast: Secondary | ICD-10-CM

## 2022-07-16 ENCOUNTER — Ambulatory Visit
Admission: RE | Admit: 2022-07-16 | Discharge: 2022-07-16 | Disposition: A | Discharge status: Home / Self Care | Payer: 59 | Source: Ambulatory Visit | Attending: Obstetrics and Gynecology | Admitting: Obstetrics and Gynecology

## 2022-07-16 DIAGNOSIS — R928 Other abnormal and inconclusive findings on diagnostic imaging of breast: Secondary | ICD-10-CM | POA: Diagnosis not present

## 2022-07-16 DIAGNOSIS — N63 Unspecified lump in unspecified breast: Secondary | ICD-10-CM | POA: Diagnosis not present

## 2022-07-16 DIAGNOSIS — N6489 Other specified disorders of breast: Secondary | ICD-10-CM | POA: Insufficient documentation

## 2022-07-16 DIAGNOSIS — R922 Inconclusive mammogram: Secondary | ICD-10-CM | POA: Diagnosis not present

## 2022-08-10 DIAGNOSIS — M25572 Pain in left ankle and joints of left foot: Secondary | ICD-10-CM | POA: Diagnosis not present

## 2022-08-10 DIAGNOSIS — S93422A Sprain of deltoid ligament of left ankle, initial encounter: Secondary | ICD-10-CM | POA: Diagnosis not present

## 2022-08-11 ENCOUNTER — Ambulatory Visit: Payer: Self-pay

## 2022-08-24 DIAGNOSIS — S93432D Sprain of tibiofibular ligament of left ankle, subsequent encounter: Secondary | ICD-10-CM | POA: Diagnosis not present

## 2024-01-10 NOTE — Progress Notes (Signed)
 PCP: Auston Reyes BIRCH, MD   Chief Complaint  Patient presents with   Gynecologic Exam    No concerns    HPI:      [name] is a 63 y.o. [phone] who LMP was No LMP recorded. Patient is postmenopausal., presents today for her annual examination.  Her menses are absent due to menopause. She does not have postmenopausal bleeding. Has occas vasomotor sx; no change in past on HRT. Sx tolerable.   Sex activity: not sexually active. She does not have vaginal dryness/sx.  Last Pap: 06/05/22 Results were: no abnormalities /neg HPV DNA.  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 There is a FH of ovarian cancer in her [place], brother with melanoma. Pt is MyRisk neg 2020, MyRisk neg; IBIS=10.6%/riskscore=9.2%. The patient does do self-breast exams.  Colonoscopy: 2/20 without abnormalities; repeat due after 10 yrs. FH colon cancer in pat uncle   Tobacco use: The patient denies current or previous tobacco use. Alcohol use: none No drug use Exercise: mod active  She does get adequate calcium and Vitamin D  in her diet.  Labs due today  Past Medical History:  Diagnosis Date   Abnormal Pap smear of cervix    BRCA negative 05/2018   MyRisk neg; IBIS=10.6%/riskscore=9.2%   Family history of ovarian cancer     Past Surgical History:  Procedure Laterality Date   CERVICAL BIOPSY  W/ LOOP ELECTRODE EXCISION     COLONOSCOPY WITH PROPOFOL  N/A 04/16/2018   Procedure: COLONOSCOPY WITH PROPOFOL ;  Surgeon: Unk Corinn Skiff, MD;  Location: [facility];  Service: Endoscopy;  Laterality: N/A;    Family History  Problem Relation Age of Onset   Breast cancer Maternal Grandmother 60   Diabetes Mellitus II Maternal Grandmother    Ovarian cancer Maternal Grandmother        23s, no testing done   Hypertension Mother    Diabetes Mellitus II Father    Hypertension  Father    Colon cancer Paternal Uncle        11s, no testing done   Lung cancer Paternal Uncle    Melanoma Brother     Social History   Socioeconomic History   Marital status: Widowed    Spouse name: Not on file   Number of children: Not on file   Years of education: Not on file   Highest education level: Not on file  Occupational History   Not on file  Tobacco Use   Smoking status: Never   Smokeless tobacco: Never  Vaping Use   Vaping status: Never Used  Substance and Sexual Activity   Alcohol use: Yes    Comment: occasionally   Drug use: No   Sexual activity: Not Currently    Birth control/protection: Post-menopausal  Other Topics Concern   Not on file  Social History Narrative   Not on file   Social Drivers of Health   Financial Resource Strain: Not on file  Food Insecurity: Not on file  Transportation Needs: Not on file  Physical Activity: Not on file  Stress: Not on file  Social Connections: Not on file  Intimate Partner Violence: Not on file    Outpatient Medications Prior to Visit  Medication Sig Dispense Refill   Cholecalciferol (VITAMIN D -3 PO) Take by mouth.     etodolac  (LODINE ) 500 MG tablet Take 1  tablet (500 mg total) by mouth 2 (two) times daily as needed (for pain). 30 tablet 0   Multiple Vitamin (MULTIVITAMIN) tablet Take 1 tablet by mouth daily.     No facility-administered medications prior to visit.     ROS:  Review of Systems  Constitutional:  Negative for fatigue, fever and unexpected weight change.  Respiratory:  Negative for cough, shortness of breath and wheezing.   Cardiovascular:  Negative for chest pain, palpitations and leg swelling.  Gastrointestinal:  Negative for blood in stool, constipation, diarrhea, nausea and vomiting.  Endocrine: Negative for cold intolerance, heat intolerance and polyuria.  Genitourinary:  Negative for dyspareunia, dysuria, flank pain, frequency, genital sores, hematuria, menstrual problem, pelvic  pain, urgency, vaginal bleeding, vaginal discharge and vaginal pain.  Musculoskeletal:  Negative for back pain, joint swelling and myalgias.  Skin:  Negative for rash.  Neurological:  Negative for dizziness, syncope, light-headedness, numbness and headaches.  Hematological:  Negative for adenopathy.  Psychiatric/Behavioral:  Negative for agitation, confusion, sleep disturbance and suicidal ideas. The patient is not nervous/anxious.   BREAST: No symptoms    Objective: BP 126/77   Pulse 69   Ht 5' 7 (1.702 m)   Wt 194 lb (88 kg)   BMI 30.38 kg/m    Physical Exam Constitutional:      Appearance: She is well-developed.  Genitourinary:     Vulva normal.     Right Labia: No rash, tenderness or lesions.    Left Labia: No tenderness, lesions or rash.    No vaginal discharge, erythema or tenderness.      Right Adnexa: not tender and no mass present.    Left Adnexa: not tender and no mass present.    No cervical friability or polyp.     Uterus is not enlarged or tender.  Rectum:     Guaiac result negative.     No tenderness.  Breasts:    Right: No mass, nipple discharge, skin change or tenderness.     Left: No mass, nipple discharge, skin change or tenderness.  Neck:     Thyroid : No thyromegaly.  Cardiovascular:     Rate and Rhythm: Normal rate and regular rhythm.     Heart sounds: Normal heart sounds. No murmur heard. Pulmonary:     Effort: Pulmonary effort is normal.     Breath sounds: Normal breath sounds.  Abdominal:     Palpations: Abdomen is soft.     Tenderness: There is no abdominal tenderness. There is no guarding or rebound.  Musculoskeletal:        General: Normal range of motion.     Cervical back: Normal range of motion.  Lymphadenopathy:     Cervical: No cervical adenopathy.  Neurological:     General: No focal deficit present.     Mental Status: She is alert and oriented to person, place, and time.     Cranial Nerves: No cranial nerve deficit.  Skin:     General: Skin is warm and dry.  Psychiatric:        Mood and Affect: Mood normal.        Behavior: Behavior normal.        Thought Content: Thought content normal.        Judgment: Judgment normal.  Vitals reviewed.     Assessment/Plan: Encounter for annual routine gynecological examination  Encounter for screening mammogram for malignant neoplasm of breast - Plan: MM 3D SCREENING MAMMOGRAM BILATERAL BREAST; pt to schedule mammo  Blood  tests for routine general physical examination - Plan: Comprehensive metabolic panel with GFR, CBC with Differential/Platelet, Hemoglobin A1c, Lipid panel         GYN counsel mammography screening, menopause, adequate intake of calcium and vitamin D , diet and exercise    F/U  Return in about 1 year (around 01/12/2025).  Carson Meche B. Breton Berns, PA-C 01/13/2024 9:12 AM

## 2024-01-13 ENCOUNTER — Encounter: Payer: Self-pay | Admitting: Obstetrics and Gynecology

## 2024-01-13 ENCOUNTER — Ambulatory Visit: Admitting: Obstetrics and Gynecology

## 2024-01-13 VITALS — BP 126/77 | HR 69 | Ht 67.0 in | Wt 194.0 lb

## 2024-01-13 DIAGNOSIS — Z01419 Encounter for gynecological examination (general) (routine) without abnormal findings: Secondary | ICD-10-CM

## 2024-01-13 DIAGNOSIS — N951 Menopausal and female climacteric states: Secondary | ICD-10-CM

## 2024-01-13 DIAGNOSIS — Z0000 Encounter for general adult medical examination without abnormal findings: Secondary | ICD-10-CM

## 2024-01-13 DIAGNOSIS — E782 Mixed hyperlipidemia: Secondary | ICD-10-CM

## 2024-01-13 DIAGNOSIS — Z1231 Encounter for screening mammogram for malignant neoplasm of breast: Secondary | ICD-10-CM

## 2024-01-13 NOTE — Patient Instructions (Addendum)
 I value your feedback and you entrusting [place] with your care. If you get a [facility] patient survey, I would appreciate you taking the time to let us know about your experience today. Thank you!  [facility] ([place])--[phone]

## 2024-01-14 ENCOUNTER — Ambulatory Visit: Payer: Self-pay | Admitting: Obstetrics and Gynecology

## 2024-01-14 LAB — LIPID PANEL
Chol/HDL Ratio: 4.1 ratio (ref 0.0–4.4)
Cholesterol, Total: 234 mg/dL — ABNORMAL HIGH (ref 100–199)
HDL: 57 mg/dL (ref >39)
LDL Chol Calc (NIH): 142 mg/dL — ABNORMAL HIGH (ref 0–99)
Triglycerides: 197 mg/dL — ABNORMAL HIGH (ref 0–149)
VLDL Cholesterol Cal: 35 mg/dL (ref 5–40)

## 2024-01-14 LAB — COMPREHENSIVE METABOLIC PANEL WITH GFR
ALT: 20 IU/L (ref 0–32)
AST: 18 IU/L (ref 0–40)
Albumin: 4.8 g/dL (ref 3.9–4.9)
Alkaline Phosphatase: 102 IU/L (ref 49–135)
BUN/Creatinine Ratio: 17 (ref 12–28)
BUN: 16 mg/dL (ref 8–27)
Bilirubin Total: 0.3 mg/dL (ref 0.0–1.2)
CO2: 22 mmol/L (ref 20–29)
Calcium: 9.9 mg/dL (ref 8.7–10.3)
Chloride: 105 mmol/L (ref 96–106)
Creatinine, Ser: 0.92 mg/dL (ref 0.57–1.00)
Globulin, Total: 2.1 g/dL (ref 1.5–4.5)
Glucose: 83 mg/dL (ref 70–99)
Potassium: 4.7 mmol/L (ref 3.5–5.2)
Sodium: 140 mmol/L (ref 134–144)
Total Protein: 6.9 g/dL (ref 6.0–8.5)
eGFR: 71 mL/min/1.73 (ref >59)

## 2024-01-14 LAB — CBC WITH DIFFERENTIAL/PLATELET
Basophils Absolute: 0 x10E3/uL (ref 0.0–0.2)
Basos: 0 %
EOS (ABSOLUTE): 0.2 x10E3/uL (ref 0.0–0.4)
Eos: 3 %
Hematocrit: 44.4 % (ref 34.0–46.6)
Hemoglobin: 15.1 g/dL (ref 11.1–15.9)
Immature Grans (Abs): 0 x10E3/uL (ref 0.0–0.1)
Immature Granulocytes: 0 %
Lymphocytes Absolute: 2.4 x10E3/uL (ref 0.7–3.1)
Lymphs: 34 %
MCH: 32.3 pg (ref 26.6–33.0)
MCHC: 34 g/dL (ref 31.5–35.7)
MCV: 95 fL (ref 79–97)
Monocytes Absolute: 0.5 x10E3/uL (ref 0.1–0.9)
Monocytes: 8 %
Neutrophils Absolute: 3.9 x10E3/uL (ref 1.4–7.0)
Neutrophils: 55 %
Platelets: 296 x10E3/uL (ref 150–450)
RBC: 4.67 x10E6/uL (ref 3.77–5.28)
RDW: 12.7 % (ref 11.7–15.4)
WBC: 7 x10E3/uL (ref 3.4–10.8)

## 2024-01-14 LAB — HEMOGLOBIN A1C
Est. average glucose Bld gHb Est-mCnc: 103 mg/dL
Hgb A1c MFr Bld: 5.2 % (ref 4.8–5.6)

## 2024-01-14 NOTE — Addendum Note (Signed)
 Addended by: WATT HILA B on: 01/14/2024 09:20 AM   Modules accepted: Orders

## 2024-02-18 ENCOUNTER — Inpatient Hospital Stay: Admission: RE | Admit: 2024-02-18 | Discharge: 2024-02-18 | Attending: Obstetrics and Gynecology

## 2024-02-18 DIAGNOSIS — Z1231 Encounter for screening mammogram for malignant neoplasm of breast: Secondary | ICD-10-CM | POA: Insufficient documentation

## 2024-02-21 ENCOUNTER — Other Ambulatory Visit: Payer: Self-pay | Admitting: Obstetrics and Gynecology

## 2024-02-21 DIAGNOSIS — R928 Other abnormal and inconclusive findings on diagnostic imaging of breast: Secondary | ICD-10-CM

## 2024-03-03 ENCOUNTER — Ambulatory Visit
Admission: RE | Admit: 2024-03-03 | Discharge: 2024-03-03 | Disposition: A | Discharge status: Home / Self Care | Source: Ambulatory Visit | Attending: Obstetrics and Gynecology | Admitting: Obstetrics and Gynecology

## 2024-03-03 DIAGNOSIS — R928 Other abnormal and inconclusive findings on diagnostic imaging of breast: Secondary | ICD-10-CM | POA: Insufficient documentation

## 2024-03-05 ENCOUNTER — Ambulatory Visit: Payer: Self-pay | Admitting: Obstetrics and Gynecology
